# Patient Record
Sex: Male | Born: 1975 | Race: White | Hispanic: No | Marital: Married | Smoking: Never smoker
Health system: Southern US, Community
[De-identification: ages and names within clinical notes are randomized; demographics above are authoritative.]

## PROBLEM LIST (undated history)

## (undated) HISTORY — PX: OTHER SURGICAL HISTORY: SHX169

## (undated) HISTORY — PX: ACHILLES TENDON REPAIR: SUR1153

---

## 2018-12-05 DIAGNOSIS — M542 Cervicalgia: Secondary | ICD-10-CM | POA: Diagnosis not present

## 2018-12-05 DIAGNOSIS — M62838 Other muscle spasm: Secondary | ICD-10-CM | POA: Diagnosis not present

## 2018-12-05 DIAGNOSIS — Z79899 Other long term (current) drug therapy: Secondary | ICD-10-CM | POA: Diagnosis not present

## 2018-12-05 DIAGNOSIS — Z885 Allergy status to narcotic agent status: Secondary | ICD-10-CM | POA: Diagnosis not present

## 2018-12-06 DIAGNOSIS — M7662 Achilles tendinitis, left leg: Secondary | ICD-10-CM | POA: Diagnosis not present

## 2018-12-06 DIAGNOSIS — M9902 Segmental and somatic dysfunction of thoracic region: Secondary | ICD-10-CM | POA: Diagnosis not present

## 2018-12-06 DIAGNOSIS — M7661 Achilles tendinitis, right leg: Secondary | ICD-10-CM | POA: Diagnosis not present

## 2018-12-09 DIAGNOSIS — M7661 Achilles tendinitis, right leg: Secondary | ICD-10-CM | POA: Diagnosis not present

## 2018-12-09 DIAGNOSIS — M9902 Segmental and somatic dysfunction of thoracic region: Secondary | ICD-10-CM | POA: Diagnosis not present

## 2018-12-09 DIAGNOSIS — M7662 Achilles tendinitis, left leg: Secondary | ICD-10-CM | POA: Diagnosis not present

## 2018-12-13 DIAGNOSIS — M7661 Achilles tendinitis, right leg: Secondary | ICD-10-CM | POA: Diagnosis not present

## 2018-12-13 DIAGNOSIS — M7662 Achilles tendinitis, left leg: Secondary | ICD-10-CM | POA: Diagnosis not present

## 2018-12-13 DIAGNOSIS — M9902 Segmental and somatic dysfunction of thoracic region: Secondary | ICD-10-CM | POA: Diagnosis not present

## 2018-12-17 DIAGNOSIS — M7661 Achilles tendinitis, right leg: Secondary | ICD-10-CM | POA: Diagnosis not present

## 2018-12-17 DIAGNOSIS — M7662 Achilles tendinitis, left leg: Secondary | ICD-10-CM | POA: Diagnosis not present

## 2018-12-17 DIAGNOSIS — M9902 Segmental and somatic dysfunction of thoracic region: Secondary | ICD-10-CM | POA: Diagnosis not present

## 2018-12-30 ENCOUNTER — Emergency Department (HOSPITAL_COMMUNITY)
Admission: EM | Admit: 2018-12-30 | Discharge: 2018-12-30 | Disposition: A | Discharge status: Home / Self Care | Payer: Commercial Managed Care - PPO | Attending: Emergency Medicine | Admitting: Emergency Medicine

## 2018-12-30 ENCOUNTER — Encounter (HOSPITAL_COMMUNITY): Payer: Self-pay | Admitting: Emergency Medicine

## 2018-12-30 DIAGNOSIS — R21 Rash and other nonspecific skin eruption: Secondary | ICD-10-CM | POA: Diagnosis present

## 2018-12-30 DIAGNOSIS — L509 Urticaria, unspecified: Secondary | ICD-10-CM | POA: Diagnosis not present

## 2018-12-30 MED ORDER — LORATADINE 10 MG PO TABS: 10.0000 mg | ORAL_TABLET | Freq: Every day | ORAL | 0 refills | Status: AC

## 2018-12-30 NOTE — Discharge Instructions (Addendum)
Please read attached information. If you experience any new or worsening signs or symptoms please return to the emergency room for evaluation. Please follow-up with your primary care provider or specialist as discussed. Please use medication prescribed only as directed and discontinue taking if you have any concerning signs or symptoms.   °

## 2018-12-30 NOTE — ED Provider Notes (Signed)
[facility] EMERGENCY DEPARTMENT Provider Note   CSN: [id number] Arrival date & time: 12/30/18  0750    History   Chief Complaint Chief Complaint  Patient presents with  . Rash    HPI Casey Wilson is a 43 y.o. male.     HPI   43 year old male presents today with rash.  He notes he was driving to work today when he developed hives to his upper extremities chest abdomen and back.  He notes his hands began to swell.  He denies any swelling of the mouth, shortness of breath.  He denies any abnormal exposures, new foods or drinks, he notes he takes atorvastatin daily but did not take it today.  Patient notes he occasionally uses loratadine as needed for nasal congestion, did not take this today.  Denies any insect bites abnormal exposures of the weekend.  He notes his work uniform he wore on Thursday has not had any new exposures.  He does note that he works at [employer] and is exposed to various things at work.  He did not take any medication prior to arrival and symptoms are dramatically resolving.  History reviewed. No pertinent past medical history.  There are no active problems to display for this patient.   History reviewed. No pertinent surgical history.      Home Medications    Prior to Admission medications   Medication Sig Start Date End Date Taking? Authorizing Provider  loratadine (CLARITIN) 10 MG tablet Take 1 tablet (10 mg total) by mouth daily. 12/30/18   Eyvonne Mechanic, PA-C    Family History History reviewed. No pertinent family history.  Social History Social History   Tobacco Use  . Smoking status: Not on file  Substance Use Topics  . Alcohol use: Not on file  . Drug use: Not on file     Allergies   Patient has no allergy information on record.   Review of Systems Review of Systems  All other systems reviewed and are negative.    Physical Exam Updated Vital Signs BP (!) 127/94   Pulse 70   Temp 97.6 F (36.4 C) (Oral)    Resp 16   SpO2 95%   Physical Exam Vitals signs and nursing note reviewed.  Constitutional:      Appearance: He is well-developed.  HENT:     Head: Normocephalic and atraumatic.     Comments: Oropharynx is clear no edema or exudate, no lesions Eyes:     General: No scleral icterus.       Right eye: No discharge.        Left eye: No discharge.     Conjunctiva/sclera: Conjunctivae normal.     Pupils: Pupils are equal, round, and reactive to light.  Neck:     Musculoskeletal: Normal range of motion.     Vascular: No JVD.     Trachea: No tracheal deviation.  Pulmonary:     Effort: Pulmonary effort is normal. No respiratory distress.     Breath sounds: Normal breath sounds. No stridor. No wheezing, rhonchi or rales.  Skin:    Comments: Faint erythematous rash to the back-no hives noted, minor excoriation noted on the left dorsal hand  Neurological:     Mental Status: He is alert and oriented to person, place, and time.     Coordination: Coordination normal.  Psychiatric:        Behavior: Behavior normal.        Thought Content: Thought content normal.  Judgment: Judgment normal.      ED Treatments / Results  Labs (all labs ordered are listed, but only abnormal results are displayed) Labs Reviewed - No data to display  EKG None  Radiology No results found.  Procedures Procedures (including critical care time)  Medications Ordered in ED Medications - No data to display   Initial Impression / Assessment and Plan / ED Course  I have reviewed the triage vital signs and the nursing notes.  Pertinent labs & imaging results that were available during my care of the patient were reviewed by me and considered in my medical decision making (see chart for details).        43 year old male presents today with hives.  Patient has had resolution of symptoms prior to my evaluation.  He has no signs of respiratory involvement.  He is well-appearing in no acute  distress.  Uncertain etiology at this time.  Patient will use antihistamines at home return immediately if he develops any new or worsening signs or symptoms.  He verbalized understanding and agreement to today's plan had no further questions or concerns at time of discharge.  Final Clinical Impressions(s) / ED Diagnoses   Final diagnoses:  Hives    ED Discharge Orders         Ordered    loratadine (CLARITIN) 10 MG tablet  Daily     12/30/18 0813           Eyvonne Mechanic, PA-C 12/30/18 6314    Benjiman Core, MD 12/30/18 [phone]

## 2018-12-30 NOTE — ED Triage Notes (Signed)
Pt reports sudden hives around 630. Unaware if allergic to anything. States he had his work uniform on and not sure if a chemical was on it.

## 2019-01-20 ENCOUNTER — Other Ambulatory Visit (HOSPITAL_BASED_OUTPATIENT_CLINIC_OR_DEPARTMENT_OTHER): Payer: Self-pay | Admitting: Physician Assistant

## 2019-01-20 ENCOUNTER — Ambulatory Visit (HOSPITAL_BASED_OUTPATIENT_CLINIC_OR_DEPARTMENT_OTHER)
Admission: RE | Admit: 2019-01-20 | Discharge: 2019-01-20 | Disposition: A | Discharge status: Home / Self Care | Payer: Commercial Managed Care - PPO | Source: Ambulatory Visit | Attending: Physician Assistant | Admitting: Physician Assistant

## 2019-01-20 ENCOUNTER — Other Ambulatory Visit: Payer: Self-pay

## 2019-01-20 DIAGNOSIS — M7989 Other specified soft tissue disorders: Secondary | ICD-10-CM | POA: Diagnosis not present

## 2019-01-20 DIAGNOSIS — R609 Edema, unspecified: Secondary | ICD-10-CM

## 2019-01-20 DIAGNOSIS — L539 Erythematous condition, unspecified: Secondary | ICD-10-CM | POA: Insufficient documentation

## 2019-04-06 IMAGING — US VENOUS DOPPLER ULTRASOUND OF  LOWER EXTREMITIES
1 series · 14 of 24 positions shown · non-contrast
Comparison: None.

CLINICAL DATA: Bilateral lower extremity erythema and swelling for
2 days

EXAM:
BILATERAL LOWER EXTREMITY VENOUS DUPLEX ULTRASOUND
TECHNIQUE: Doppler venous assessment of the bilateral lower extremity deep
venous system was performed, including characterization of spectral
flow, compressibility, and phasicity.

[Series 1: venous doppler ultrasound of lower extremities · 14 of 59 slices shown]
[im 1/59]
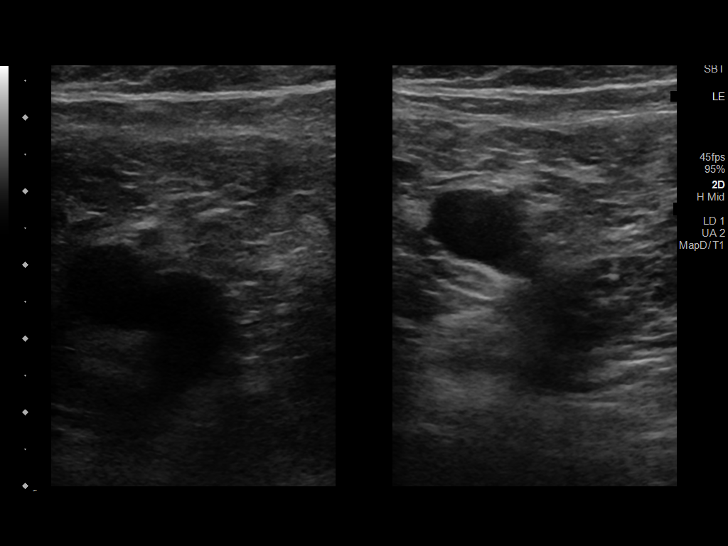
[im 6/59]
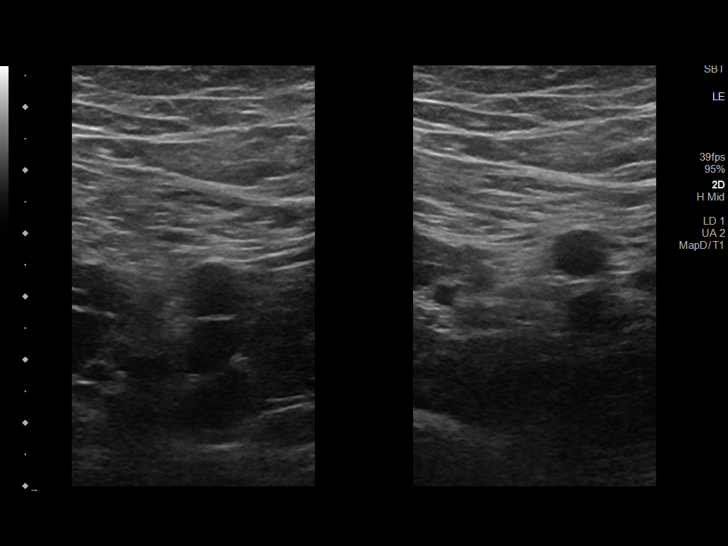
[im 11/59]
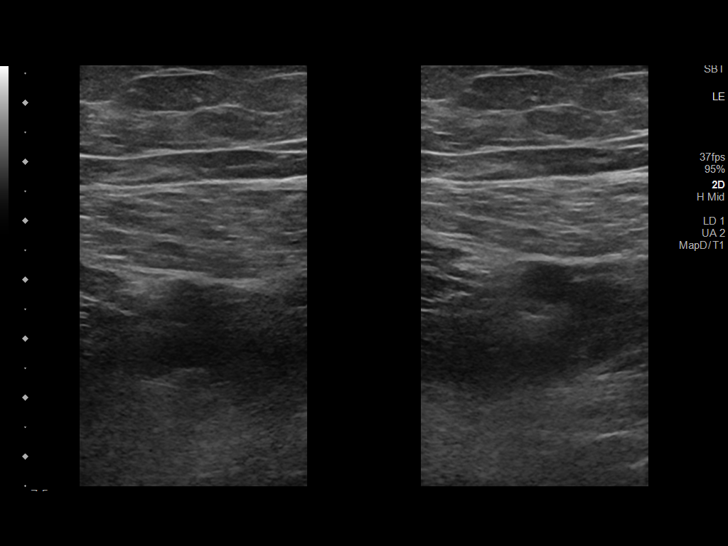
[im 16/59]
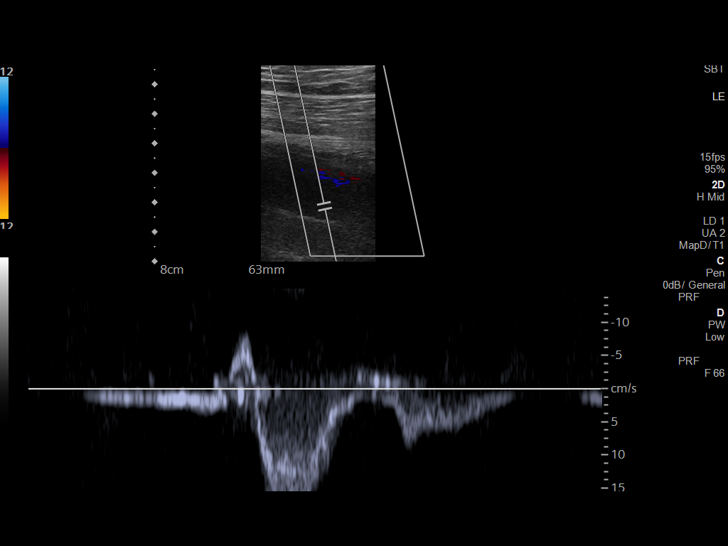
[im 18/59]
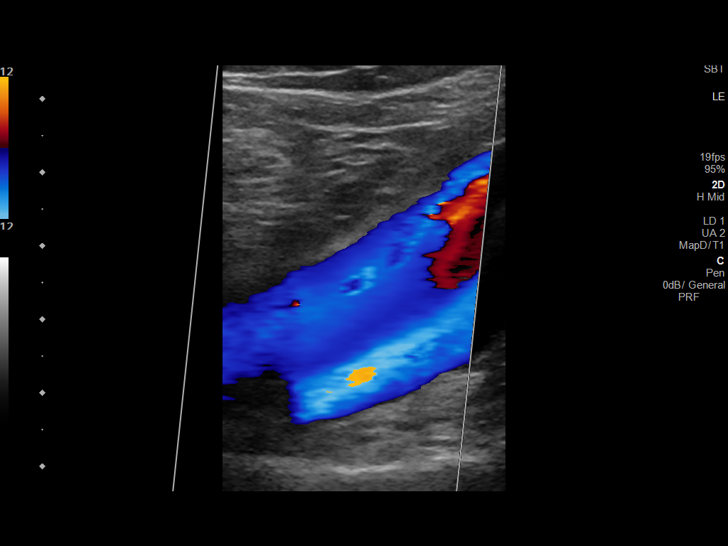
[im 23/59]
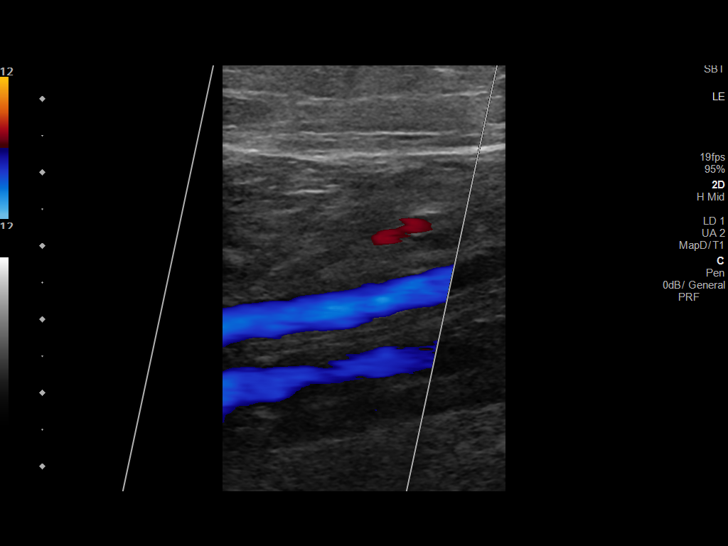
[im 28/59]
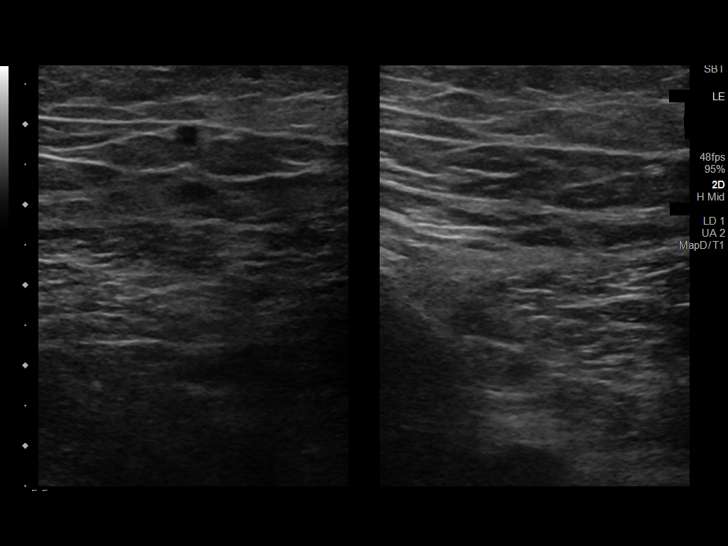
[im 31/59]
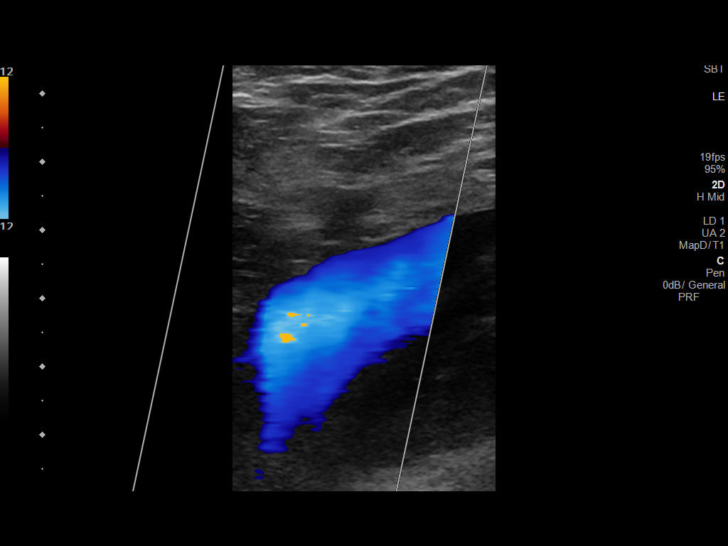
[im 36/59]
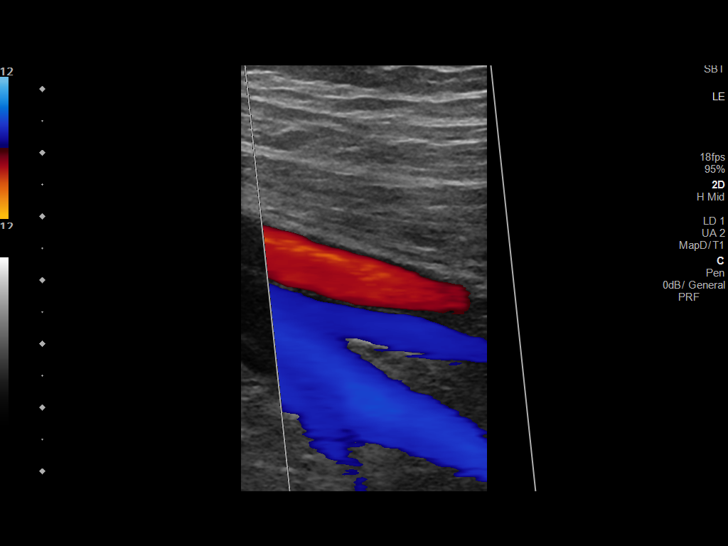
[im 41/59]
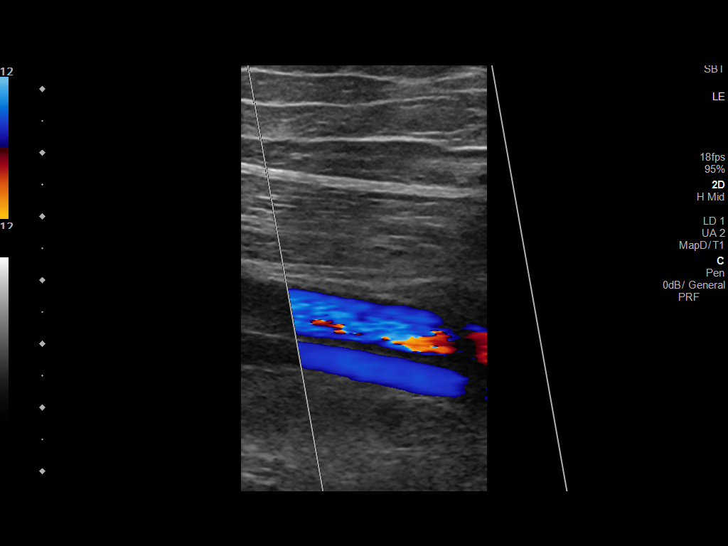
[im 46/59]
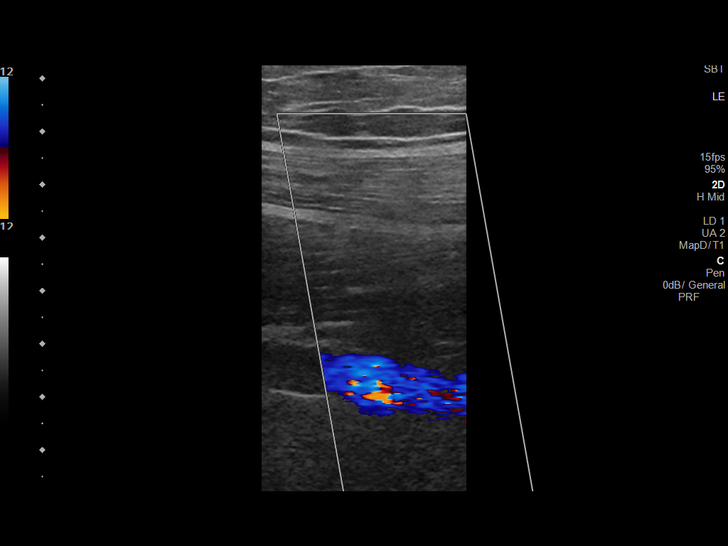
[im 48/59]
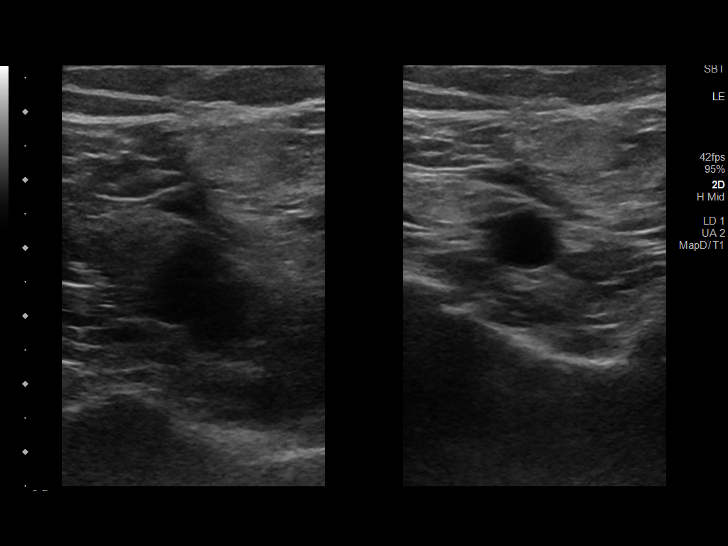
[im 53/59]
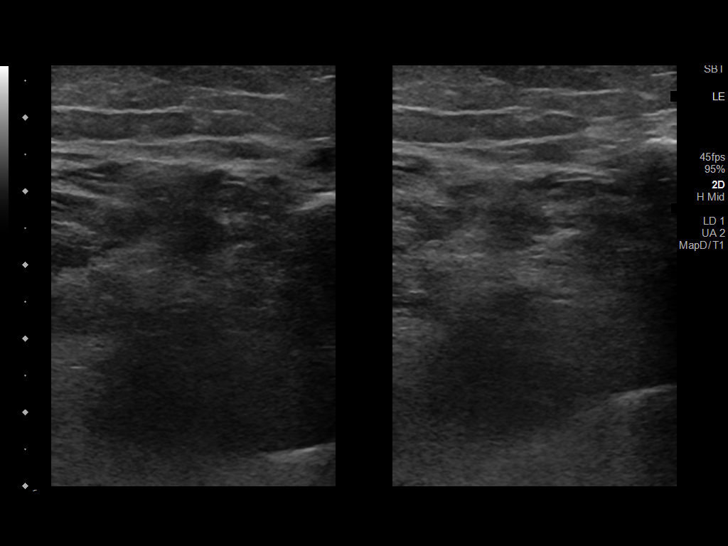
[im 59/59]
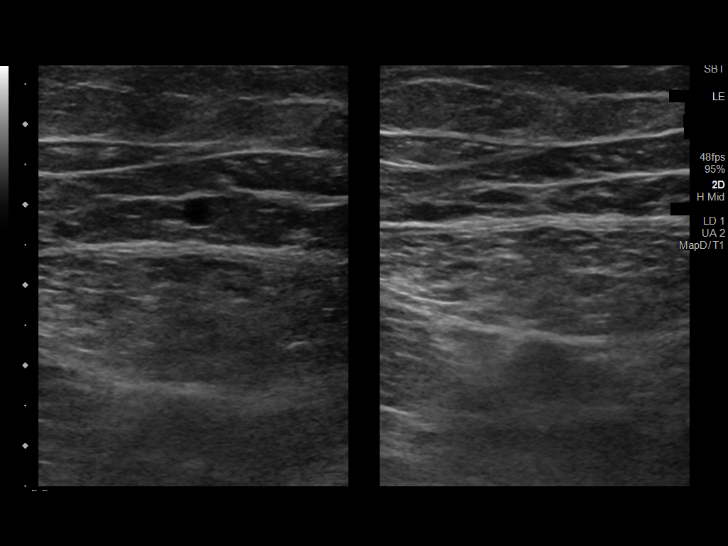

[14 of 24 positions shown; findings below may reference images not displayed]

FINDINGS: There is complete compressibility of the bilateral common femoral,
femoral, and popliteal veins. Doppler analysis demonstrates
respiratory phasicity and augmentation of flow with calf
compression. No obvious superficial vein or calf vein thrombosis.
IMPRESSION: No evidence of lower extremity DVT.

## 2020-11-08 ENCOUNTER — Emergency Department: Admit: 2020-11-08 | Discharge status: Absent without leave | Payer: Self-pay

## 2020-11-08 ENCOUNTER — Emergency Department (INDEPENDENT_AMBULATORY_CARE_PROVIDER_SITE_OTHER)
Admission: EM | Admit: 2020-11-08 | Discharge: 2020-11-08 | Disposition: A | Discharge status: Home / Self Care | Payer: Commercial Managed Care - PPO | Source: Home / Self Care

## 2020-11-08 ENCOUNTER — Other Ambulatory Visit: Payer: Self-pay

## 2020-11-08 DIAGNOSIS — R197 Diarrhea, unspecified: Secondary | ICD-10-CM

## 2020-11-08 DIAGNOSIS — R1084 Generalized abdominal pain: Secondary | ICD-10-CM

## 2020-11-08 MED ORDER — CIPROFLOXACIN HCL 500 MG PO TABS: 500.0000 mg | ORAL_TABLET | Freq: Two times a day (BID) | ORAL | 0 refills | Status: AC

## 2020-11-08 MED ORDER — METRONIDAZOLE 500 MG PO TABS: 500.0000 mg | ORAL_TABLET | Freq: Three times a day (TID) | ORAL | 0 refills | Status: AC

## 2020-11-08 NOTE — Discharge Instructions (Signed)
°  Please take the antibiotics as prescribed and be sure to schedule a follow up appointment with primary care or stomach specialist this week for recheck of symptoms, especially if not improving. Even if symptoms do resolve with medication prescribed, it is recommended you follow up as you may need further testing such as a colonoscopy.   Call 911 or have someone drive you to the hospital if symptoms significantly worsening.

## 2020-11-08 NOTE — ED Triage Notes (Signed)
Pt has been having recurrent diarrhea since 12/31. He is concerned for dehydration since not able to tolerate PO intake. Reports abdominal cramping as well. Denies being on antibiotics recently.
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
## 2020-11-15 LAB — TIQ-NTM

## 2020-11-17 LAB — GASTROINTESTINAL PATHOGEN PANEL PCR

## 2021-08-11 ENCOUNTER — Emergency Department (HOSPITAL_COMMUNITY): Payer: Commercial Managed Care - PPO

## 2021-08-11 ENCOUNTER — Observation Stay (HOSPITAL_COMMUNITY): Payer: Commercial Managed Care - PPO | Admitting: Anesthesiology

## 2021-08-11 ENCOUNTER — Inpatient Hospital Stay (HOSPITAL_COMMUNITY)
Admission: EM | Admit: 2021-08-11 | Discharge: 2021-08-15 | DRG: 511 | Disposition: A | Discharge status: Home / Self Care | Payer: Commercial Managed Care - PPO | Attending: Surgery | Admitting: Surgery

## 2021-08-11 ENCOUNTER — Encounter (HOSPITAL_COMMUNITY): Payer: Self-pay

## 2021-08-11 ENCOUNTER — Encounter (HOSPITAL_COMMUNITY): Admission: EM | Disposition: A | Discharge status: Home / Self Care | Payer: Self-pay | Source: Home / Self Care

## 2021-08-11 ENCOUNTER — Other Ambulatory Visit: Payer: Self-pay

## 2021-08-11 DIAGNOSIS — R531 Weakness: Secondary | ICD-10-CM

## 2021-08-11 DIAGNOSIS — S62102A Fracture of unspecified carpal bone, left wrist, initial encounter for closed fracture: Principal | ICD-10-CM

## 2021-08-11 DIAGNOSIS — M4856XA Collapsed vertebra, not elsewhere classified, lumbar region, initial encounter for fracture: Secondary | ICD-10-CM | POA: Diagnosis present

## 2021-08-11 DIAGNOSIS — S71112A Laceration without foreign body, left thigh, initial encounter: Secondary | ICD-10-CM | POA: Diagnosis present

## 2021-08-11 DIAGNOSIS — Z885 Allergy status to narcotic agent status: Secondary | ICD-10-CM

## 2021-08-11 DIAGNOSIS — S43102A Unspecified dislocation of left acromioclavicular joint, initial encounter: Secondary | ICD-10-CM | POA: Diagnosis present

## 2021-08-11 DIAGNOSIS — M4854XA Collapsed vertebra, not elsewhere classified, thoracic region, initial encounter for fracture: Secondary | ICD-10-CM | POA: Diagnosis present

## 2021-08-11 DIAGNOSIS — S52372A Galeazzi's fracture of left radius, initial encounter for closed fracture: Principal | ICD-10-CM | POA: Diagnosis present

## 2021-08-11 DIAGNOSIS — Y92410 Unspecified street and highway as the place of occurrence of the external cause: Secondary | ICD-10-CM

## 2021-08-11 DIAGNOSIS — E785 Hyperlipidemia, unspecified: Secondary | ICD-10-CM | POA: Diagnosis present

## 2021-08-11 DIAGNOSIS — Z20822 Contact with and (suspected) exposure to covid-19: Secondary | ICD-10-CM | POA: Diagnosis present

## 2021-08-11 HISTORY — PX: ORIF WRIST FRACTURE: SHX2133

## 2021-08-11 LAB — COMPREHENSIVE METABOLIC PANEL
ALT: 33 U/L (ref 0–44)
AST: 29 U/L (ref 15–41)
Albumin: 3.5 g/dL (ref 3.5–5.0)
Alkaline Phosphatase: 70 U/L (ref 38–126)
Anion gap: 8 (ref 5–15)
BUN: 11 mg/dL (ref 6–20)
CO2: 23 mmol/L (ref 22–32)
Calcium: 8.5 mg/dL — ABNORMAL LOW (ref 8.9–10.3)
Chloride: 105 mmol/L (ref 98–111)
Creatinine, Ser: 0.96 mg/dL (ref 0.61–1.24)
GFR, Estimated: >60 mL/min (ref >60)
Glucose, Bld: 131 mg/dL — ABNORMAL HIGH (ref 70–99)
Potassium: 3.6 mmol/L (ref 3.5–5.1)
Sodium: 136 mmol/L (ref 135–145)
Total Bilirubin: 0.9 mg/dL (ref 0.3–1.2)
Total Protein: 6.1 g/dL — ABNORMAL LOW (ref 6.5–8.1)

## 2021-08-11 LAB — URINALYSIS, ROUTINE W REFLEX MICROSCOPIC
Bilirubin Urine: NEGATIVE
Glucose, UA: NEGATIVE mg/dL
Hgb urine dipstick: NEGATIVE
Ketones, ur: NEGATIVE mg/dL
Leukocytes,Ua: NEGATIVE
Nitrite: NEGATIVE
Protein, ur: NEGATIVE mg/dL
Specific Gravity, Urine: 1.033 — ABNORMAL HIGH (ref 1.005–1.030)
pH: 7 (ref 5.0–8.0)

## 2021-08-11 LAB — PROTIME-INR
INR: 1 (ref 0.8–1.2)
Prothrombin Time: 13.2 seconds (ref 11.4–15.2)

## 2021-08-11 LAB — I-STAT CHEM 8, ED
BUN: 13 mg/dL (ref 6–20)
Calcium, Ion: 1.06 mmol/L — ABNORMAL LOW (ref 1.15–1.40)
Chloride: 105 mmol/L (ref 98–111)
Creatinine, Ser: 0.9 mg/dL (ref 0.61–1.24)
Glucose, Bld: 129 mg/dL — ABNORMAL HIGH (ref 70–99)
HCT: 43 % (ref 39.0–52.0)
Hemoglobin: 14.6 g/dL (ref 13.0–17.0)
Potassium: 3.7 mmol/L (ref 3.5–5.1)
Sodium: 139 mmol/L (ref 135–145)
TCO2: 23 mmol/L (ref 22–32)

## 2021-08-11 LAB — RESP PANEL BY RT-PCR (FLU A&B, COVID) ARPGX2
Influenza A by PCR: NEGATIVE
Influenza B by PCR: NEGATIVE
SARS Coronavirus 2 by RT PCR: NEGATIVE

## 2021-08-11 LAB — LACTIC ACID, PLASMA: Lactic Acid, Venous: 1.9 mmol/L (ref 0.5–1.9)

## 2021-08-11 LAB — CBC
HCT: 44.2 % (ref 39.0–52.0)
Hemoglobin: 14.9 g/dL (ref 13.0–17.0)
MCH: 30.9 pg (ref 26.0–34.0)
MCHC: 33.7 g/dL (ref 30.0–36.0)
MCV: 91.7 fL (ref 80.0–100.0)
Platelets: 177 10*3/uL (ref 150–400)
RBC: 4.82 MIL/uL (ref 4.22–5.81)
RDW: 13.6 % (ref 11.5–15.5)
WBC: 9.1 10*3/uL (ref 4.0–10.5)
nRBC: 0 % (ref 0.0–0.2)

## 2021-08-11 LAB — ETHANOL: Alcohol, Ethyl (B): <10 mg/dL (ref <10)

## 2021-08-11 LAB — SURGICAL PCR SCREEN
MRSA, PCR: NEGATIVE
Staphylococcus aureus: POSITIVE — AB

## 2021-08-11 LAB — SAMPLE TO BLOOD BANK

## 2021-08-11 SURGERY — OPEN REDUCTION INTERNAL FIXATION (ORIF) WRIST FRACTURE
Anesthesia: General | Site: Wrist | Laterality: Left

## 2021-08-11 MED ORDER — METHOCARBAMOL 500 MG PO TABS: 750.0000 mg | ORAL_TABLET | Freq: Four times a day (QID) | ORAL | Status: DC

## 2021-08-11 MED ORDER — ROCURONIUM BROMIDE 10 MG/ML (PF) SYRINGE
PREFILLED_SYRINGE | INTRAVENOUS | Status: AC
  Filled 2021-08-11: qty 10

## 2021-08-11 MED ORDER — ONDANSETRON HCL 4 MG/2ML IJ SOLN
INTRAMUSCULAR | Status: DC | PRN
  Administered 2021-08-11: 4 mg via INTRAVENOUS

## 2021-08-11 MED ORDER — FENTANYL CITRATE (PF) 250 MCG/5ML IJ SOLN
INTRAMUSCULAR | Status: AC
  Filled 2021-08-11: qty 5

## 2021-08-11 MED ORDER — SODIUM CHLORIDE 0.9 % IV BOLUS
1000.0000 mL | Freq: Once | INTRAVENOUS | Status: AC
  Administered 2021-08-11: 1000 mL via INTRAVENOUS

## 2021-08-11 MED ORDER — BUPIVACAINE HCL (PF) 0.25 % IJ SOLN
INTRAMUSCULAR | Status: AC
  Filled 2021-08-11: qty 20

## 2021-08-11 MED ORDER — FENTANYL CITRATE (PF) 250 MCG/5ML IJ SOLN
INTRAMUSCULAR | Status: DC | PRN
  Administered 2021-08-11: 50 ug via INTRAVENOUS
  Administered 2021-08-11: 100 ug via INTRAVENOUS
  Administered 2021-08-11: 50 ug via INTRAVENOUS

## 2021-08-11 MED ORDER — MORPHINE SULFATE (PF) 4 MG/ML IV SOLN
4.0000 mg | Freq: Once | INTRAVENOUS | Status: AC
  Administered 2021-08-11: 4 mg via INTRAVENOUS
  Filled 2021-08-11: qty 1

## 2021-08-11 MED ORDER — HYDROMORPHONE HCL 1 MG/ML IJ SOLN
1.0000 mg | Freq: Once | INTRAMUSCULAR | Status: AC
Start: 2021-08-11 — End: 2021-08-11
  Administered 2021-08-11: 1 mg via INTRAVENOUS
  Filled 2021-08-11: qty 1

## 2021-08-11 MED ORDER — IOHEXOL 350 MG/ML SOLN
80.0000 mL | Freq: Once | INTRAVENOUS | Status: AC | PRN
  Administered 2021-08-11: 80 mL via INTRAVENOUS

## 2021-08-11 MED ORDER — ONDANSETRON 4 MG PO TBDP: 4.0000 mg | ORAL_TABLET | Freq: Four times a day (QID) | ORAL | Status: DC | PRN

## 2021-08-11 MED ORDER — HYDROMORPHONE HCL 1 MG/ML IJ SOLN
1.0000 mg | Freq: Once | INTRAMUSCULAR | Status: AC
  Administered 2021-08-11: 1 mg via INTRAVENOUS
  Filled 2021-08-11: qty 1

## 2021-08-11 MED ORDER — ENOXAPARIN SODIUM 30 MG/0.3ML IJ SOSY
30.0000 mg | PREFILLED_SYRINGE | Freq: Two times a day (BID) | INTRAMUSCULAR | Status: DC
  Administered 2021-08-12 – 2021-08-15 (×7): 30 mg via SUBCUTANEOUS
  Filled 2021-08-11 (×8): qty 0.3

## 2021-08-11 MED ORDER — KETOROLAC TROMETHAMINE 30 MG/ML IJ SOLN: 30.0000 mg | Freq: Once | INTRAMUSCULAR | Status: DC | PRN

## 2021-08-11 MED ORDER — PANTOPRAZOLE SODIUM 40 MG PO TBEC
40.0000 mg | DELAYED_RELEASE_TABLET | Freq: Every day | ORAL | Status: DC
  Administered 2021-08-12 – 2021-08-15 (×4): 40 mg via ORAL
  Filled 2021-08-11 (×4): qty 1

## 2021-08-11 MED ORDER — PHENYLEPHRINE HCL-NACL 20-0.9 MG/250ML-% IV SOLN
INTRAVENOUS | Status: DC | PRN
  Administered 2021-08-11: 40 ug/min via INTRAVENOUS

## 2021-08-11 MED ORDER — POLYETHYLENE GLYCOL 3350 17 G PO PACK: 17.0000 g | PACK | Freq: Every day | ORAL | Status: DC | PRN

## 2021-08-11 MED ORDER — ONDANSETRON HCL 4 MG/2ML IJ SOLN
INTRAMUSCULAR | Status: AC
  Filled 2021-08-11: qty 2

## 2021-08-11 MED ORDER — BUPIVACAINE HCL 0.25 % IJ SOLN
INTRAMUSCULAR | Status: DC | PRN
Start: 2021-08-11 — End: 2021-08-11
  Administered 2021-08-11: 10 mL

## 2021-08-11 MED ORDER — CEFAZOLIN IN SODIUM CHLORIDE 3-0.9 GM/100ML-% IV SOLN
3.0000 g | INTRAVENOUS | Status: AC
  Administered 2021-08-11: 3 g via INTRAVENOUS

## 2021-08-11 MED ORDER — MIDAZOLAM HCL 2 MG/2ML IJ SOLN
INTRAMUSCULAR | Status: AC
  Administered 2021-08-11: 1 mg via INTRAVENOUS
  Filled 2021-08-11: qty 2

## 2021-08-11 MED ORDER — OXYCODONE HCL 5 MG PO TABS: 5.0000 mg | ORAL_TABLET | Freq: Once | ORAL | Status: DC | PRN

## 2021-08-11 MED ORDER — CHLORHEXIDINE GLUCONATE 4 % EX LIQD: 60.0000 mL | Freq: Once | CUTANEOUS | Status: DC

## 2021-08-11 MED ORDER — MIDAZOLAM HCL 2 MG/2ML IJ SOLN
INTRAMUSCULAR | Status: AC
  Filled 2021-08-11: qty 2

## 2021-08-11 MED ORDER — CHLORHEXIDINE GLUCONATE 0.12 % MT SOLN: 15.0000 mL | Freq: Once | OROMUCOSAL | Status: AC

## 2021-08-11 MED ORDER — ORAL CARE MOUTH RINSE: 15.0000 mL | Freq: Once | OROMUCOSAL | Status: AC

## 2021-08-11 MED ORDER — CHLORHEXIDINE GLUCONATE 0.12 % MT SOLN
OROMUCOSAL | Status: AC
  Administered 2021-08-11: 15 mL via OROMUCOSAL
  Filled 2021-08-11: qty 15

## 2021-08-11 MED ORDER — FENTANYL CITRATE PF 50 MCG/ML IJ SOSY
50.0000 ug | PREFILLED_SYRINGE | Freq: Once | INTRAMUSCULAR | Status: AC
  Administered 2021-08-11: 50 ug via INTRAVENOUS

## 2021-08-11 MED ORDER — SODIUM CHLORIDE 0.9 % IV SOLN
INTRAVENOUS | Status: DC
Start: 2021-08-11 — End: 2021-08-13

## 2021-08-11 MED ORDER — ASCORBIC ACID 500 MG PO TABS
1000.0000 mg | ORAL_TABLET | Freq: Every day | ORAL | Status: DC
  Administered 2021-08-11 – 2021-08-15 (×4): 1000 mg via ORAL
  Filled 2021-08-11 (×5): qty 2

## 2021-08-11 MED ORDER — MIDAZOLAM HCL 2 MG/2ML IJ SOLN: 1.0000 mg | Freq: Once | INTRAMUSCULAR | Status: AC

## 2021-08-11 MED ORDER — LIDOCAINE-EPINEPHRINE (PF) 2 %-1:200000 IJ SOLN
10.0000 mL | Freq: Once | INTRAMUSCULAR | Status: AC
  Administered 2021-08-11: 10 mL via INTRADERMAL
  Filled 2021-08-11: qty 20

## 2021-08-11 MED ORDER — CEFAZOLIN IN SODIUM CHLORIDE 3-0.9 GM/100ML-% IV SOLN
INTRAVENOUS | Status: AC
  Filled 2021-08-11: qty 100

## 2021-08-11 MED ORDER — ONDANSETRON HCL 4 MG/2ML IJ SOLN: 4.0000 mg | Freq: Once | INTRAMUSCULAR | Status: DC | PRN

## 2021-08-11 MED ORDER — OXYCODONE HCL 5 MG PO TABS
5.0000 mg | ORAL_TABLET | ORAL | Status: DC | PRN
  Administered 2021-08-12 – 2021-08-15 (×10): 10 mg via ORAL
  Filled 2021-08-11 (×10): qty 2
  Filled 2021-08-11: qty 1
  Filled 2021-08-11: qty 2

## 2021-08-11 MED ORDER — LIDOCAINE 2% (20 MG/ML) 5 ML SYRINGE
INTRAMUSCULAR | Status: AC
  Filled 2021-08-11: qty 5

## 2021-08-11 MED ORDER — ROCURONIUM BROMIDE 10 MG/ML (PF) SYRINGE
PREFILLED_SYRINGE | INTRAVENOUS | Status: DC | PRN
  Administered 2021-08-11: 60 mg via INTRAVENOUS
  Administered 2021-08-11: 40 mg via INTRAVENOUS

## 2021-08-11 MED ORDER — PROPOFOL 10 MG/ML IV BOLUS
INTRAVENOUS | Status: DC | PRN
  Administered 2021-08-11: 180 mg via INTRAVENOUS

## 2021-08-11 MED ORDER — METHOCARBAMOL 750 MG PO TABS
750.0000 mg | ORAL_TABLET | Freq: Four times a day (QID) | ORAL | Status: DC
  Administered 2021-08-11 – 2021-08-15 (×15): 750 mg via ORAL
  Filled 2021-08-11 (×15): qty 1

## 2021-08-11 MED ORDER — LIDOCAINE 2% (20 MG/ML) 5 ML SYRINGE
INTRAMUSCULAR | Status: DC | PRN
  Administered 2021-08-11: 100 mg via INTRAVENOUS

## 2021-08-11 MED ORDER — METOPROLOL TARTRATE 5 MG/5ML IV SOLN: 5.0000 mg | Freq: Four times a day (QID) | INTRAVENOUS | Status: DC | PRN

## 2021-08-11 MED ORDER — LACTATED RINGERS IV SOLN: INTRAVENOUS | Status: DC

## 2021-08-11 MED ORDER — MIDAZOLAM HCL 2 MG/2ML IJ SOLN
INTRAMUSCULAR | Status: DC | PRN
  Administered 2021-08-11: 2 mg via INTRAVENOUS

## 2021-08-11 MED ORDER — SUCCINYLCHOLINE CHLORIDE 200 MG/10ML IV SOSY
PREFILLED_SYRINGE | INTRAVENOUS | Status: DC | PRN
  Administered 2021-08-11: 200 mg via INTRAVENOUS

## 2021-08-11 MED ORDER — ONDANSETRON HCL 4 MG/2ML IJ SOLN
4.0000 mg | Freq: Four times a day (QID) | INTRAMUSCULAR | Status: DC | PRN
  Administered 2021-08-11 – 2021-08-12 (×2): 4 mg via INTRAVENOUS
  Filled 2021-08-11 (×2): qty 2

## 2021-08-11 MED ORDER — TETANUS-DIPHTH-ACELL PERTUSSIS 5-2.5-18.5 LF-MCG/0.5 IM SUSY
0.5000 mL | PREFILLED_SYRINGE | Freq: Once | INTRAMUSCULAR | Status: DC
  Filled 2021-08-11 (×2): qty 0.5

## 2021-08-11 MED ORDER — POVIDONE-IODINE 10 % EX SWAB
2.0000 "application " | Freq: Once | CUTANEOUS | Status: AC
  Administered 2021-08-11: 2 via TOPICAL

## 2021-08-11 MED ORDER — SUGAMMADEX SODIUM 200 MG/2ML IV SOLN
INTRAVENOUS | Status: DC | PRN
  Administered 2021-08-11: 300 mg via INTRAVENOUS

## 2021-08-11 MED ORDER — SUGAMMADEX SODIUM 500 MG/5ML IV SOLN
INTRAVENOUS | Status: AC
  Filled 2021-08-11: qty 5

## 2021-08-11 MED ORDER — DEXAMETHASONE SODIUM PHOSPHATE 10 MG/ML IJ SOLN
INTRAMUSCULAR | Status: AC
  Filled 2021-08-11: qty 1

## 2021-08-11 MED ORDER — DEXAMETHASONE SODIUM PHOSPHATE 10 MG/ML IJ SOLN
INTRAMUSCULAR | Status: DC | PRN
  Administered 2021-08-11: 10 mg via INTRAVENOUS

## 2021-08-11 MED ORDER — SODIUM CHLORIDE 0.9 % IR SOLN
Status: DC | PRN
  Administered 2021-08-11: 1000 mL

## 2021-08-11 MED ORDER — ROPIVACAINE HCL 5 MG/ML IJ SOLN
INTRAMUSCULAR | Status: DC | PRN
  Administered 2021-08-11: 30 mL via PERINEURAL

## 2021-08-11 MED ORDER — SUCCINYLCHOLINE CHLORIDE 200 MG/10ML IV SOSY
PREFILLED_SYRINGE | INTRAVENOUS | Status: AC
  Filled 2021-08-11: qty 10

## 2021-08-11 MED ORDER — HYDROMORPHONE HCL 1 MG/ML IJ SOLN
1.0000 mg | INTRAMUSCULAR | Status: DC | PRN
  Administered 2021-08-11 – 2021-08-12 (×4): 1 mg via INTRAVENOUS
  Filled 2021-08-11 (×5): qty 1

## 2021-08-11 MED ORDER — OXYCODONE HCL 5 MG/5ML PO SOLN: 5.0000 mg | Freq: Once | ORAL | Status: DC | PRN

## 2021-08-11 MED ORDER — FENTANYL CITRATE PF 50 MCG/ML IJ SOSY
50.0000 ug | PREFILLED_SYRINGE | Freq: Once | INTRAMUSCULAR | Status: DC
Start: 2021-08-11 — End: 2021-08-11

## 2021-08-11 MED ORDER — SODIUM CHLORIDE 0.9 % IV BOLUS
1000.0000 mL | Freq: Once | INTRAVENOUS | Status: AC
Start: 2021-08-11 — End: 2021-08-11
  Administered 2021-08-11: 1000 mL via INTRAVENOUS

## 2021-08-11 MED ORDER — DOCUSATE SODIUM 100 MG PO CAPS
100.0000 mg | ORAL_CAPSULE | Freq: Two times a day (BID) | ORAL | Status: DC
  Administered 2021-08-11 – 2021-08-15 (×8): 100 mg via ORAL
  Filled 2021-08-11 (×8): qty 1

## 2021-08-11 MED ORDER — PROPOFOL 10 MG/ML IV BOLUS
INTRAVENOUS | Status: AC
  Filled 2021-08-11: qty 20

## 2021-08-11 MED ORDER — ONDANSETRON HCL 4 MG/2ML IJ SOLN
4.0000 mg | Freq: Once | INTRAMUSCULAR | Status: AC
  Administered 2021-08-11: 4 mg via INTRAVENOUS
  Filled 2021-08-11: qty 2

## 2021-08-11 MED ORDER — HYDROMORPHONE HCL 1 MG/ML IJ SOLN: 0.2500 mg | INTRAMUSCULAR | Status: DC | PRN

## 2021-08-11 MED ORDER — FENTANYL CITRATE PF 50 MCG/ML IJ SOSY
PREFILLED_SYRINGE | INTRAMUSCULAR | Status: AC
  Filled 2021-08-11: qty 1

## 2021-08-11 MED ORDER — BISACODYL 10 MG RE SUPP: 10.0000 mg | Freq: Every day | RECTAL | Status: DC | PRN

## 2021-08-11 MED ORDER — BUPIVACAINE HCL (PF) 0.25 % IJ SOLN
INTRAMUSCULAR | Status: AC
  Filled 2021-08-11: qty 10

## 2021-08-11 MED ORDER — ACETAMINOPHEN 500 MG PO TABS
1000.0000 mg | ORAL_TABLET | Freq: Three times a day (TID) | ORAL | Status: DC
  Administered 2021-08-11: 1000 mg via ORAL
  Filled 2021-08-11: qty 2

## 2021-08-11 MED ORDER — PANTOPRAZOLE SODIUM 40 MG IV SOLR: 40.0000 mg | Freq: Every day | INTRAVENOUS | Status: DC

## 2021-08-11 MED ORDER — FENTANYL CITRATE (PF) 100 MCG/2ML IJ SOLN
INTRAMUSCULAR | Status: AC
  Administered 2021-08-11: 50 ug
  Filled 2021-08-11: qty 2

## 2021-08-11 MED ORDER — ACETAMINOPHEN 500 MG PO TABS: 1000.0000 mg | ORAL_TABLET | Freq: Three times a day (TID) | ORAL | Status: DC

## 2021-08-11 SURGICAL SUPPLY — 44 items
BAG COUNTER SPONGE SURGICOUNT (BAG) ×2 IMPLANT
BIT DRILL 2.8 QUICK RELEASE (BIT) ×1 IMPLANT
BNDG COHESIVE 2X5 TAN STRL LF (GAUZE/BANDAGES/DRESSINGS) IMPLANT
BNDG ELASTIC 4X5.8 VLCR STR LF (GAUZE/BANDAGES/DRESSINGS) ×4 IMPLANT
BNDG ESMARK 4X9 LF (GAUZE/BANDAGES/DRESSINGS) ×2 IMPLANT
BNDG GAUZE ELAST 4 BULKY (GAUZE/BANDAGES/DRESSINGS) ×4 IMPLANT
CORD BIPOLAR FORCEPS 12FT (ELECTRODE) ×2 IMPLANT
DRILL 2.8 QUICK RELEASE (BIT) ×2
DRSG ADAPTIC 3X8 NADH LF (GAUZE/BANDAGES/DRESSINGS) ×2 IMPLANT
DRSG EMULSION OIL 3X3 NADH (GAUZE/BANDAGES/DRESSINGS) ×2 IMPLANT
DRSG PAD ABDOMINAL 8X10 ST (GAUZE/BANDAGES/DRESSINGS) ×4 IMPLANT
GAUZE SPONGE 4X4 12PLY STRL (GAUZE/BANDAGES/DRESSINGS) ×2 IMPLANT
GAUZE XEROFORM 1X8 LF (GAUZE/BANDAGES/DRESSINGS) ×2 IMPLANT
GAUZE XEROFORM 5X9 LF (GAUZE/BANDAGES/DRESSINGS) ×2 IMPLANT
GOWN STRL REUS W/ TWL LRG LVL3 (GOWN DISPOSABLE) ×1 IMPLANT
GOWN STRL REUS W/TWL LRG LVL3 (GOWN DISPOSABLE) ×1
GUIDEWIRE ORTHO MINI ACTK .045 (WIRE) ×4 IMPLANT
HANDPIECE INTERPULSE COAX TIP (DISPOSABLE)
KIT BASIN OR (CUSTOM PROCEDURE TRAY) ×2 IMPLANT
KIT TURNOVER KIT B (KITS) ×2 IMPLANT
MANIFOLD NEPTUNE II (INSTRUMENTS) ×2 IMPLANT
NS IRRIG 1000ML POUR BTL (IV SOLUTION) ×2 IMPLANT
PACK ORTHO EXTREMITY (CUSTOM PROCEDURE TRAY) ×2 IMPLANT
PAD ARMBOARD 7.5X6 YLW CONV (MISCELLANEOUS) ×4 IMPLANT
PAD CAST 4YDX4 CTTN HI CHSV (CAST SUPPLIES) ×4 IMPLANT
PADDING CAST COTTON 4X4 STRL (CAST SUPPLIES) ×4
PLATE 8 HOLE RADIUS (Plate) ×2 IMPLANT
SCREW HEXALOBE NON-LOCK 3.5X14 (Screw) ×12 IMPLANT
SCREW NLCKG 13 3.5X13 HEXA (Screw) ×2 IMPLANT
SCREW NON-LOCK 3.5X13 (Screw) ×4 IMPLANT
SET HNDPC FAN SPRY TIP SCT (DISPOSABLE) IMPLANT
SPLINT PLASTER EXTRA FAST 3X15 (CAST SUPPLIES) ×3
SPLINT PLASTER GYPS XFAST 3X15 (CAST SUPPLIES) ×3 IMPLANT
SPONGE T-LAP 18X18 ~~LOC~~+RFID (SPONGE) ×2 IMPLANT
SPONGE T-LAP 4X18 ~~LOC~~+RFID (SPONGE) ×2 IMPLANT
SUT ETHILON 3 0 PS 1 (SUTURE) ×4 IMPLANT
SUT VIC AB 3-0 FS2 27 (SUTURE) ×2 IMPLANT
SWAB CULTURE ESWAB REG 1ML (MISCELLANEOUS) IMPLANT
TOWEL GREEN STERILE (TOWEL DISPOSABLE) ×2 IMPLANT
TOWEL GREEN STERILE FF (TOWEL DISPOSABLE) ×2 IMPLANT
TUBE CONNECTING 12X1/4 (SUCTIONS) ×2 IMPLANT
UNDERPAD 30X36 HEAVY ABSORB (UNDERPADS AND DIAPERS) ×2 IMPLANT
WATER STERILE IRR 1000ML POUR (IV SOLUTION) ×2 IMPLANT
YANKAUER SUCT BULB TIP NO VENT (SUCTIONS) ×2 IMPLANT
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
## 2021-08-11 NOTE — ED Notes (Signed)
Obtained consent for procedure. Pt's wife has all pt's belongings.

## 2021-08-11 NOTE — Anesthesia Preprocedure Evaluation (Addendum)
Anesthesia Evaluation  Patient identified by MRN, date of birth, ID band Patient awake    Reviewed: Allergy & Precautions, NPO status , Patient's Chart, lab work & pertinent test results  Airway Mallampati: II  TM Distance: >3 FB Neck ROM: Full    Dental no notable dental hx.    Pulmonary neg pulmonary ROS,    Pulmonary exam normal breath sounds clear to auscultation       Cardiovascular negative cardio ROS Normal cardiovascular exam Rhythm:Regular Rate:Normal     Neuro/Psych negative neurological ROS  negative psych ROS   GI/Hepatic negative GI ROS, Neg liver ROS,   Endo/Other  Morbid obesity  Renal/GU negative Renal ROS  negative genitourinary   Musculoskeletal negative musculoskeletal ROS (+)   Abdominal   Peds negative pediatric ROS (+)  Hematology negative hematology ROS (+)   Anesthesia Other Findings   Reproductive/Obstetrics negative OB ROS                            Anesthesia Physical Anesthesia Plan  ASA: 2  Anesthesia Plan: General   Post-op Pain Management:  Regional for Post-op pain   Induction: Intravenous  PONV Risk Score and Plan: 2 and Ondansetron and Dexamethasone  Airway Management Planned: Oral ETT  Additional Equipment:   Intra-op Plan:   Post-operative Plan: Extubation in OR  Informed Consent: I have reviewed the patients History and Physical, chart, labs and discussed the procedure including the risks, benefits and alternatives for the proposed anesthesia with the patient or authorized representative who has indicated his/her understanding and acceptance.     Dental advisory given  Plan Discussed with: CRNA and Surgeon  Anesthesia Plan Comments: (Patient vomited as I was preparing to do block. Still has mild nausea)       Anesthesia Quick Evaluation

## 2021-08-11 NOTE — Progress Notes (Signed)
OT Cancellation Note  Patient Details Name: Casey Wilson MRN: [id number] DOB: 12-11-1975   Cancelled Treatment:    Reason Eval/Treat Not Completed: Other (comment). Per Neurosurgery note: "CT L spine showed mild L1 superior endplate compression fracture.  Alignment appears intact.  Recommend TLSO brace when OOB." In order for OT to work with patient and determine transfers and ADL Pre-Op of LUE brace must be present. OT is pending brace arrival. PT communicating with Brace supplier ([employer]) and OT will perform evaluation once notified of arrival of TLSO brace.  Evern Bio Sudie Bandel 08/11/2021, 11:25 AM  Nyoka Cowden OTR/L Acute Rehabilitation Services Pager: [phone] Office: [phone]

## 2021-08-11 NOTE — Op Note (Signed)
I assisted Surgeon(s) and Role:    * Betha Loa, MD - Primary    Cindee Salt, MD - Assisting on the Procedure(s): OPEN REDUCTION INTERNAL FIXATION (ORIF) DISTAL RADIUS POSSIBLE PINNING DRUJ, POSSIBLE TRIANGULAR FIBER CARTILAGE COMPLEX REPAIR on 08/11/2021.  I provided assistance on this case as follows: setup, approach, identification, reduction, stabilization, and fixation of the radius fracture, assessment of the DRUJ, closure of the wound and application of dressings and  splint.  Electronically signed by: Cindee Salt, MD Date: 08/11/2021 Time: 5:08 PM

## 2021-08-11 NOTE — ED Notes (Signed)
Pt transport CT 2 with TCRN
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
## 2021-08-11 NOTE — Transfer of Care (Signed)
Immediate Anesthesia Transfer of Care Note  Patient: Casey Wilson  Procedure(s) Performed: OPEN REDUCTION INTERNAL FIXATION (ORIF) DISTAL RADIUS POSSIBLE PINNING DRUJ, POSSIBLE TRIANGULAR FIBER CARTILAGE COMPLEX REPAIR (Left: Wrist)  Patient Location: PACU  Anesthesia Type:General  Level of Consciousness: drowsy  Airway & Oxygen Therapy: Patient Spontanous Breathing and Patient connected to face mask oxygen  Post-op Assessment: Report given to RN and Post -op Vital signs reviewed and stable  Post vital signs: Reviewed and stable  Last Vitals:  Vitals Value Taken Time  BP 137/91 08/11/21 1728  Temp 36.6 C 08/11/21 1728  Pulse 82 08/11/21 1736  Resp 13 08/11/21 1736  SpO2 94 % 08/11/21 1736  Vitals shown include unvalidated device data.  Last Pain:  Vitals:   08/11/21 1728  TempSrc:   PainSc: Asleep         Complications: No notable events documented.
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
## 2021-08-11 NOTE — Progress Notes (Signed)
Orthopedic Tech Progress Note Patient Details:  Casey Wilson June 07, 1976 [id number]  Ortho Devices Type of Ortho Device: Sugartong splint Ortho Device/Splint Location: lue Ortho Device/Splint Interventions: Ordered, Application, Adjustment   Post Interventions Patient Tolerated: Well Instructions Provided: Poper ambulation with device, Care of device Waiting on TLSO to be delivered.  Casey Wilson L [name] 08/11/2021, 11:27 AM

## 2021-08-11 NOTE — ED Notes (Signed)
PT at bedside.

## 2021-08-11 NOTE — Progress Notes (Addendum)
Patient arrived to floor 1845. Patient stable. VS good. Skin assessed and documented. Tele placed on patient. Wife at bedside. Meal ordered from cafe. Settled patient in for nightshift.   Allergy: acetaminophen combined with oxycodone makes the patient act out and mean per wife.

## 2021-08-11 NOTE — Anesthesia Procedure Notes (Signed)
Procedure Name: Intubation Date/Time: 08/11/2021 3:49 PM Performed by: Tressia Miners, CRNA Pre-anesthesia Checklist: Patient identified, Emergency Drugs available, Suction available and Patient being monitored Patient Re-evaluated:Patient Re-evaluated prior to induction Oxygen Delivery Method: Circle System Utilized Preoxygenation: Pre-oxygenation with 100% oxygen Induction Type: IV induction, Rapid sequence and Cricoid Pressure applied Laryngoscope Size: Glidescope and 4 Grade View: Grade I Tube type: Oral Tube size: 8.0 mm Number of attempts: 1 Airway Equipment and Method: Rigid stylet and Video-laryngoscopy Placement Confirmation: ETT inserted through vocal cords under direct vision, positive ETCO2 and breath sounds checked- equal and bilateral Secured at: 23 cm Tube secured with: Tape Dental Injury: Teeth and Oropharynx as per pre-operative assessment  Comments: Pt is nauseous and vomiting in pre-op. RSI with glidescope with successful intubation. OGT passed to decompress stomach
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
## 2021-08-11 NOTE — Anesthesia Procedure Notes (Signed)
Anesthesia Procedure Image    

## 2021-08-11 NOTE — Consult Note (Signed)
CC:  s/p [facility]  HPI:     Patient is a 45 y.o. male presents after [facility].  He complains of left wrist and left shoulder pain, as well as moderate low back pain.  He underwent L wrist ORIF a few hours ago.  No neurologic complaints.    Patient Active Problem List   Diagnosis Date Noted   Left wrist fracture 08/11/2021   History reviewed. No pertinent past medical history.  History reviewed. No pertinent surgical history.  Medications Prior to Admission  Medication Sig Dispense Refill Last Dose   atorvastatin (LIPITOR) 20 MG tablet Take 20 mg by mouth daily.      loratadine (CLARITIN) 10 MG tablet Take 10 mg by mouth daily.      Not on File  Social History   Tobacco Use   Smoking status: Never   Smokeless tobacco: Never  Substance Use Topics   Alcohol use: Not on file    History reviewed. No pertinent family history.   Review of Systems Pertinent items noted in HPI and remainder of comprehensive ROS otherwise negative.  Objective:   Patient Vitals for the past 8 hrs:  BP Temp Temp src Pulse Resp SpO2  08/11/21 1836 132/72 98.4 F (36.9 C) Oral 71 -- 91 %  08/11/21 1813 (!) 142/88 98.4 F (36.9 C) -- 68 15 91 %  08/11/21 1758 (!) 143/93 -- -- 74 15 92 %  08/11/21 1743 (!) 143/94 -- -- 83 13 94 %  08/11/21 1728 (!) 137/91 97.8 F (36.6 C) -- 90 17 96 %  08/11/21 1424 127/80 -- -- 70 13 95 %  08/11/21 1419 (!) 136/94 -- -- 67 16 94 %  08/11/21 1409 (!) 147/81 -- -- 69 16 96 %  08/11/21 1403 (!) 147/86 -- -- 66 17 96 %  08/11/21 1340 137/75 97.9 F (36.6 C) Oral 71 16 93 %  08/11/21 1317 -- 98 F (36.7 C) Oral -- -- --  08/11/21 1315 (!) 111/91 -- -- 69 -- 93 %  08/11/21 1300 130/76 -- -- 66 -- 94 %  08/11/21 1215 136/72 -- -- 68 17 97 %  08/11/21 1145 120/75 -- -- 64 17 95 %  08/11/21 1100 129/80 -- -- 73 20 93 %   No intake/output data recorded. Total I/O In: 8100 [I.V.:5000; IV Piggyback:3100] Out: 1470 [Urine:1450; Blood:20]      General : Alert,  cooperative, no distress, appears stated age   Head:  Normocephalic/atraumatic    Eyes: PERRL, conjunctiva/corneas clear, EOM's intact. Fundi could not be visualized Neck: Supple Chest:  Respirations unlabored Chest wall: no tenderness or deformity Heart: Regular rate and rhythm Abdomen: Soft, nontender and nondistended Extremities: L wrist splinted Skin: normal turgor, color and texture Neurologic:  Alert, oriented x 3.  Eyes open spontaneously. PERRL, EOMI, VFC, no facial droop. V1-3 intact.  No dysarthria, tongue protrusion symmetric.  CNII-XII intact. Normal strength, sensation and reflexes throughout.  No pronator drift, full strength in legs       Data ReviewRadiology review:   Mild L1 superior endplate fracture, likely acute.  Questionable T3 superior endplate compression fracture, age-indeterminate  Assessment:   Active Problems:   Left wrist fracture  L1 superior endplate fracture after [facility]  Plan:   - recommend TLSO be worn when OOB, avoid repetitive bending, twisting, or forceful lifting - he can f/u in neurosurgery clinic in 4-6 weeks with a standing x-ray in the brace

## 2021-08-11 NOTE — Progress Notes (Signed)
  Inpatient Rehab Admissions Coordinator :  Per therapy recommendations patient was screened for CIR candidacy by Ottie Glazier RN MSN. Patient is not yet at a level to tolerate the intensity required to pursue a CIR admit due to eval on ED stretcher. Patient may have the potential to progress to become a candidate. The CIR admissions team will follow and monitor for progress and place a Rehab Consult order if felt to be appropriate. Please contact me with any questions.  Ottie Glazier RN MSN Admissions Coordinator [phone]
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
## 2021-08-11 NOTE — ED Notes (Signed)
Help patient use the urinal patient is resting with family at bedside

## 2021-08-11 NOTE — Progress Notes (Signed)
PT Cancellation Note  Patient Details Name: Casey Wilson MRN: [id number] DOB: August 15, 1976   Cancelled Treatment:    Reason Eval/Treat Not Completed: Medical issues which prohibited therapy Pt awaiting brace. Will reattempt as soon as pt receives brace.   Cindee Salt, DPT  Acute Rehabilitation Services  Pager: [phone] Office: [phone]    Lehman Prom 08/11/2021, 11:26 AM

## 2021-08-11 NOTE — Anesthesia Procedure Notes (Signed)
Anesthesia Regional Block: Supraclavicular block   Pre-Anesthetic Checklist: , timeout performed,  Correct Patient, Correct Site, Correct Laterality,  Correct Procedure, Correct Position, site marked,  Risks and benefits discussed,  Surgical consent,  Pre-op evaluation,  At surgeon's request and post-op pain management  Laterality: Left  Prep: chloraprep       Needles:  Injection technique: Single-shot  Needle Type: Echogenic Needle     Needle Length: 9cm      Additional Needles:   Procedures:,,,, ultrasound used (permanent image in chart),,    Narrative:  Start time: 08/11/2021 2:01 PM End time: 08/11/2021 2:13 PM Injection made incrementally with aspirations every 5 mL.  Performed by: Personally  Anesthesiologist: Eilene Ghazi, MD  Additional Notes: Patient tolerated the procedure well without complications  On inspiration it was very difficult to get a good view. Location of SCA would move 2 cm between inspiration and expiration
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
## 2021-08-11 NOTE — Progress Notes (Signed)
Orthopedic Tech Progress Note Patient Details:  Casey Wilson 1976-10-29 [id number]  Patient ID: Casey Wilson, male   DOB: 1976-10-02, 45 y.o.   MRN: [id number]  Casey Wilson 08/11/2021, 7:08 AM
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
## 2021-08-12 DIAGNOSIS — S52372A Galeazzi's fracture of left radius, initial encounter for closed fracture: Secondary | ICD-10-CM | POA: Diagnosis present

## 2021-08-12 DIAGNOSIS — M4856XA Collapsed vertebra, not elsewhere classified, lumbar region, initial encounter for fracture: Secondary | ICD-10-CM | POA: Diagnosis present

## 2021-08-12 DIAGNOSIS — Z885 Allergy status to narcotic agent status: Secondary | ICD-10-CM | POA: Diagnosis not present

## 2021-08-12 DIAGNOSIS — M4854XA Collapsed vertebra, not elsewhere classified, thoracic region, initial encounter for fracture: Secondary | ICD-10-CM | POA: Diagnosis present

## 2021-08-12 DIAGNOSIS — Y92410 Unspecified street and highway as the place of occurrence of the external cause: Secondary | ICD-10-CM | POA: Diagnosis not present

## 2021-08-12 DIAGNOSIS — S43102A Unspecified dislocation of left acromioclavicular joint, initial encounter: Secondary | ICD-10-CM | POA: Diagnosis present

## 2021-08-12 DIAGNOSIS — E785 Hyperlipidemia, unspecified: Secondary | ICD-10-CM | POA: Diagnosis present

## 2021-08-12 DIAGNOSIS — S71112A Laceration without foreign body, left thigh, initial encounter: Secondary | ICD-10-CM | POA: Diagnosis present

## 2021-08-12 DIAGNOSIS — Z20822 Contact with and (suspected) exposure to covid-19: Secondary | ICD-10-CM | POA: Diagnosis present

## 2021-08-12 LAB — BASIC METABOLIC PANEL
Anion gap: 5 (ref 5–15)
BUN: 11 mg/dL (ref 6–20)
CO2: 23 mmol/L (ref 22–32)
Calcium: 8.4 mg/dL — ABNORMAL LOW (ref 8.9–10.3)
Chloride: 104 mmol/L (ref 98–111)
Creatinine, Ser: 0.84 mg/dL (ref 0.61–1.24)
GFR, Estimated: >60 mL/min (ref >60)
Glucose, Bld: 152 mg/dL — ABNORMAL HIGH (ref 70–99)
Potassium: 4.1 mmol/L (ref 3.5–5.1)
Sodium: 132 mmol/L — ABNORMAL LOW (ref 135–145)

## 2021-08-12 LAB — CBC
HCT: 40.2 % (ref 39.0–52.0)
Hemoglobin: 13.5 g/dL (ref 13.0–17.0)
MCH: 30.5 pg (ref 26.0–34.0)
MCHC: 33.6 g/dL (ref 30.0–36.0)
MCV: 91 fL (ref 80.0–100.0)
Platelets: 216 10*3/uL (ref 150–400)
RBC: 4.42 MIL/uL (ref 4.22–5.81)
RDW: 13.3 % (ref 11.5–15.5)
WBC: 9.9 10*3/uL (ref 4.0–10.5)
nRBC: 0 % (ref 0.0–0.2)

## 2021-08-12 MED ORDER — CHLORHEXIDINE GLUCONATE CLOTH 2 % EX PADS
6.0000 | MEDICATED_PAD | Freq: Every day | CUTANEOUS | Status: DC
Start: 2021-08-12 — End: 2021-08-15
  Administered 2021-08-12 – 2021-08-15 (×4): 6 via TOPICAL

## 2021-08-12 MED ORDER — ACETAMINOPHEN 500 MG PO TABS: 1000.0000 mg | ORAL_TABLET | Freq: Four times a day (QID) | ORAL | Status: DC | PRN

## 2021-08-12 MED ORDER — SODIUM CHLORIDE 0.9 % IV SOLN
25.0000 mg | Freq: Four times a day (QID) | INTRAVENOUS | Status: DC | PRN
  Administered 2021-08-12: 25 mg via INTRAVENOUS
  Filled 2021-08-12 (×2): qty 1

## 2021-08-12 MED ORDER — KETOROLAC TROMETHAMINE 15 MG/ML IJ SOLN
15.0000 mg | Freq: Four times a day (QID) | INTRAMUSCULAR | Status: DC
  Administered 2021-08-12 – 2021-08-14 (×9): 15 mg via INTRAVENOUS
  Filled 2021-08-12 (×9): qty 1

## 2021-08-12 MED ORDER — HYDROMORPHONE HCL 1 MG/ML IJ SOLN: 1.0000 mg | INTRAMUSCULAR | Status: DC | PRN

## 2021-08-12 MED ORDER — MUPIROCIN 2 % EX OINT
1.0000 "application " | TOPICAL_OINTMENT | Freq: Two times a day (BID) | CUTANEOUS | Status: DC
  Administered 2021-08-12 – 2021-08-15 (×8): 1 via NASAL
  Filled 2021-08-12: qty 22

## 2021-08-12 MED ORDER — POLYETHYLENE GLYCOL 3350 17 G PO PACK
17.0000 g | PACK | Freq: Every day | ORAL | Status: DC
  Administered 2021-08-12 – 2021-08-13 (×2): 17 g via ORAL
  Filled 2021-08-12 (×2): qty 1
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
## 2021-08-12 NOTE — TOC CAGE-AID Note (Addendum)
Transition of Care [facility]) - CAGE-AID Screening   Patient Details  Name: Suleiman Finigan MRN: [id number] Date of Birth: 1976-02-21  Transition of Care [facility]) CM/SW Contact:    Lossie Faes Tarpley-Carter, LCSWA Phone Number: 08/12/2021, 3:37 PM   Clinical Narrative: Pt participated in Cage-Aid.  Pt stated he does not use substance or ETOH.  Pt was not offered resources, due to no usage of substance or ETOH.    Caren Garske Tarpley-Carter, MSW, LCSW-A Pronouns:  She/Her/Hers [facility] Clinical Social Worker Direct Number:  [phone] [email or web address]     CAGE-AID Screening:    Have You Ever Felt You Ought to Cut Down on Your Drinking or Drug Use?: No Have People Annoyed You By [employer] Your Drinking Or Drug Use?: No Have You Felt Bad Or Guilty About Your Drinking Or Drug Use?: No Have You Ever Had a Drink or Used Drugs First Thing In The Morning to Steady Your Nerves or to Get Rid of a Hangover?: No CAGE-AID Score: 0  Substance Abuse Education Offered: No

## 2021-08-12 NOTE — Plan of Care (Signed)
  Problem: Education: Goal: Knowledge of General Education information will improve Description: Including pain rating scale, medication(s)/side effects and non-pharmacologic comfort measures Outcome: Progressing   Problem: Health Behavior/Discharge Planning: Goal: Ability to manage health-related needs will improve Outcome: Progressing   Problem: Clinical Measurements: Goal: Diagnostic test results will improve Outcome: Progressing   

## 2021-08-12 NOTE — Anesthesia Postprocedure Evaluation (Signed)
Anesthesia Post Note  Patient: Casey Wilson  Procedure(Wilson) Performed: OPEN REDUCTION INTERNAL FIXATION (ORIF) DISTAL RADIUS POSSIBLE PINNING DRUJ, POSSIBLE TRIANGULAR FIBER CARTILAGE COMPLEX REPAIR (Left: Wrist)     Patient location during evaluation: PACU Anesthesia Type: General Level of consciousness: awake and alert Pain management: pain level controlled Vital Signs Assessment: post-procedure vital signs reviewed and stable Respiratory status: spontaneous breathing, nonlabored ventilation, respiratory function stable and patient connected to nasal cannula oxygen Cardiovascular status: blood pressure returned to baseline and stable Postop Assessment: no apparent nausea or vomiting Anesthetic complications: no   No notable events documented.  Last Vitals:  Vitals:   08/12/21 0445 08/12/21 0802  BP: 109/78 113/68  Pulse: 81 72  Resp: 17 14  Temp: 36.7 C 36.9 C  SpO2: 91% 94%    Last Pain:  Vitals:   08/12/21 0802  TempSrc: Oral  PainSc:                  Casey Wilson     

## 2021-08-12 NOTE — Anesthesia Postprocedure Evaluation (Signed)
Anesthesia Post Note  Patient: Casey Wilson  Procedure(s) Performed: OPEN REDUCTION INTERNAL FIXATION (ORIF) DISTAL RADIUS POSSIBLE PINNING DRUJ, POSSIBLE TRIANGULAR FIBER CARTILAGE COMPLEX REPAIR (Left: Wrist)     Patient location during evaluation: PACU Anesthesia Type: General Level of consciousness: awake and alert Pain management: pain level controlled Vital Signs Assessment: post-procedure vital signs reviewed and stable Respiratory status: spontaneous breathing, nonlabored ventilation, respiratory function stable and patient connected to nasal cannula oxygen Cardiovascular status: blood pressure returned to baseline and stable Postop Assessment: no apparent nausea or vomiting Anesthetic complications: no   No notable events documented.  Last Vitals:  Vitals:   08/12/21 0445 08/12/21 0802  BP: 109/78 113/68  Pulse: 81 72  Resp: 17 14  Temp: 36.7 C 36.9 C  SpO2: 91% 94%    Last Pain:  Vitals:   08/12/21 0802  TempSrc: Oral  PainSc:                  Dustee Bottenfield S

## 2021-08-13 ENCOUNTER — Encounter (HOSPITAL_COMMUNITY): Payer: Self-pay | Admitting: Orthopedic Surgery

## 2021-08-13 ENCOUNTER — Encounter (INDEPENDENT_AMBULATORY_CARE_PROVIDER_SITE_OTHER): Payer: Self-pay

## 2021-08-13 LAB — HIV ANTIBODY (ROUTINE TESTING W REFLEX): HIV Screen 4th Generation wRfx: NONREACTIVE

## 2021-08-13 MED ORDER — SODIUM CHLORIDE 0.9% FLUSH
3.0000 mL | Freq: Two times a day (BID) | INTRAVENOUS | Status: DC
  Administered 2021-08-13 – 2021-08-15 (×4): 3 mL via INTRAVENOUS

## 2021-08-13 MED ORDER — SODIUM CHLORIDE 0.9 % IV SOLN: 250.0000 mL | INTRAVENOUS | Status: DC | PRN

## 2021-08-13 MED ORDER — SODIUM CHLORIDE 0.9% FLUSH: 3.0000 mL | INTRAVENOUS | Status: DC | PRN
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
## 2021-08-13 NOTE — Care Management (Signed)
PT recommends outpatient PT, placed order for referral to OP rehab on [address]

## 2021-08-13 NOTE — Progress Notes (Signed)
Physical Therapy Discharge Patient Details Name: Casey Wilson MRN: [id number] DOB: May 22, 1976 Today's Date: 08/13/2021 Time: 0100-0115 PT Time Calculation (min) (ACUTE ONLY): 15 min  Patient discharged from PT services secondary to goals met and no further PT needs identified.  Please see latest therapy progress note for current level of functioning and progress toward goals.    Progress and discharge plan discussed with patient and/or caregiver: Patient/Caregiver agrees with plan  GP     [name] A. Yaqub Arney, PT, DPT Acute Rehabilitation Services Office: [place] 08/13/2021, 1:31 PM
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
## 2021-08-13 NOTE — Plan of Care (Signed)
  Problem: Education: Goal: Knowledge of General Education information will improve Description: Including pain rating scale, medication(s)/side effects and non-pharmacologic comfort measures Outcome: Progressing   Problem: Health Behavior/Discharge Planning: Goal: Ability to manage health-related needs will improve Outcome: Progressing   Problem: Clinical Measurements: Goal: Ability to maintain clinical measurements within normal limits will improve Outcome: Progressing Goal: Will remain free from infection Outcome: Progressing Goal: Diagnostic test results will improve Outcome: Progressing   Problem: Activity: Goal: Risk for activity intolerance will decrease Outcome: Progressing   Problem: Nutrition: Goal: Adequate nutrition will be maintained Outcome: Progressing   Problem: Coping: Goal: Level of anxiety will decrease Outcome: Progressing   Problem: Elimination: Goal: Will not experience complications related to bowel motility Outcome: Progressing Goal: Will not experience complications related to urinary retention Outcome: Progressing   Problem: Pain Managment: Goal: General experience of comfort will improve Outcome: Progressing   Problem: Safety: Goal: Ability to remain free from injury will improve Outcome: Progressing   Problem: Skin Integrity: Goal: Risk for impaired skin integrity will decrease Outcome: Progressing   Report received from previous shift. VS obtained, shift assessments completed - see flowsheets. Scheduled pain medications given as ordered, patient denies need for PRN medications - see MAR. OOB with standby assist and TLSO brace, tolerating activity well with steady gait. L arm sling in place at all times. Dressing to LUE CDI. L thigh and L great toe dressing also remain CDI. Patient currently resting in bed, bed in lowest position. Denies needs. Call bell within reach.

## 2021-08-13 NOTE — Progress Notes (Signed)
Inpatient Rehab Admissions Coordinator:  Consult received. However, PT has discharged pt from their services and updated d/c recommendations to outpatient. Pt no longer requires the intensity of CIR.  Will sign off.    Wolfgang Phoenix, MS, CCC-SLP Admissions Coordinator [phone]

## 2021-08-14 MED ORDER — KETOROLAC TROMETHAMINE 30 MG/ML IJ SOLN
30.0000 mg | Freq: Four times a day (QID) | INTRAMUSCULAR | Status: DC
  Administered 2021-08-14 – 2021-08-15 (×4): 30 mg via INTRAVENOUS
  Filled 2021-08-14 (×4): qty 1

## 2021-08-14 MED ORDER — POLYETHYLENE GLYCOL 3350 17 G PO PACK
17.0000 g | PACK | Freq: Two times a day (BID) | ORAL | Status: DC
  Administered 2021-08-14: 17 g via ORAL
  Filled 2021-08-14 (×3): qty 1

## 2021-08-14 NOTE — Plan of Care (Signed)
  Problem: Education: Goal: Knowledge of General Education information will improve Description: Including pain rating scale, medication(s)/side effects and non-pharmacologic comfort measures Outcome: Progressing   Problem: Health Behavior/Discharge Planning: Goal: Ability to manage health-related needs will improve Outcome: Progressing   Problem: Clinical Measurements: Goal: Ability to maintain clinical measurements within normal limits will improve Outcome: Progressing Goal: Will remain free from infection Outcome: Progressing Goal: Diagnostic test results will improve Outcome: Progressing   Problem: Activity: Goal: Risk for activity intolerance will decrease Outcome: Progressing   Problem: Nutrition: Goal: Adequate nutrition will be maintained Outcome: Progressing   Problem: Coping: Goal: Level of anxiety will decrease Outcome: Progressing   Problem: Elimination: Goal: Will not experience complications related to bowel motility Outcome: Not Progressing Goal: Will not experience complications related to urinary retention Outcome: Progressing   Problem: Pain Managment: Goal: General experience of comfort will improve Outcome: Progressing   Problem: Safety: Goal: Ability to remain free from injury will improve Outcome: Progressing   Problem: Skin Integrity: Goal: Risk for impaired skin integrity will decrease Outcome: Progressing   Report received and care assumed from previous shift. Progressing towards goals as outlined above. VS obtained, shift assessments completed - see flowsheets. PRN pain medications given as needed - see MAR. OOB independently with TLSO brace, NWB L UE (sling in use at all times) - tolerating activity well with steady gait. L UE, L thigh, L toe dressing remains CDI. Patient currently resting in bed, bed in lowest position. Denies needs. Call bell within reach.

## 2021-08-14 NOTE — Plan of Care (Signed)
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
## 2021-08-15 LAB — CBC
HCT: 37 % — ABNORMAL LOW (ref 39.0–52.0)
Hemoglobin: 12.3 g/dL — ABNORMAL LOW (ref 13.0–17.0)
MCH: 30.6 pg (ref 26.0–34.0)
MCHC: 33.2 g/dL (ref 30.0–36.0)
MCV: 92 fL (ref 80.0–100.0)
Platelets: 187 10*3/uL (ref 150–400)
RBC: 4.02 MIL/uL — ABNORMAL LOW (ref 4.22–5.81)
RDW: 13.2 % (ref 11.5–15.5)
WBC: 7.6 10*3/uL (ref 4.0–10.5)
nRBC: 0 % (ref 0.0–0.2)

## 2021-08-15 LAB — BASIC METABOLIC PANEL
Anion gap: 6 (ref 5–15)
BUN: 11 mg/dL (ref 6–20)
CO2: 26 mmol/L (ref 22–32)
Calcium: 8.4 mg/dL — ABNORMAL LOW (ref 8.9–10.3)
Chloride: 103 mmol/L (ref 98–111)
Creatinine, Ser: 0.81 mg/dL (ref 0.61–1.24)
GFR, Estimated: >60 mL/min (ref >60)
Glucose, Bld: 95 mg/dL (ref 70–99)
Potassium: 3.7 mmol/L (ref 3.5–5.1)
Sodium: 135 mmol/L (ref 135–145)

## 2021-08-15 MED ORDER — METHOCARBAMOL 750 MG PO TABS: 750.0000 mg | ORAL_TABLET | Freq: Four times a day (QID) | ORAL | 1 refills | Status: AC | PRN

## 2021-08-15 MED ORDER — OXYCODONE HCL 10 MG PO TABS: 10.0000 mg | ORAL_TABLET | Freq: Four times a day (QID) | ORAL | 0 refills | Status: AC | PRN

## 2021-08-15 MED ORDER — POLYETHYLENE GLYCOL 3350 17 G PO PACK: 17.0000 g | PACK | Freq: Two times a day (BID) | ORAL | 0 refills | Status: AC

## 2021-08-15 MED ORDER — IBUPROFEN 200 MG PO TABS: 400.0000 mg | ORAL_TABLET | Freq: Three times a day (TID) | ORAL | Status: AC | PRN

## 2021-08-15 MED ORDER — DOCUSATE SODIUM 100 MG PO CAPS: 100.0000 mg | ORAL_CAPSULE | Freq: Two times a day (BID) | ORAL | 0 refills | Status: AC

## 2021-08-15 MED ORDER — ASCORBIC ACID 1000 MG PO TABS: 1000.0000 mg | ORAL_TABLET | Freq: Every day | ORAL | Status: AC
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
## 2021-08-15 NOTE — Plan of Care (Signed)
  Problem: Education: Goal: Knowledge of General Education information will improve Description: Including pain rating scale, medication(s)/side effects and non-pharmacologic comfort measures Outcome: Progressing   Problem: Health Behavior/Discharge Planning: Goal: Ability to manage health-related needs will improve Outcome: Progressing   Problem: Clinical Measurements: Goal: Ability to maintain clinical measurements within normal limits will improve Outcome: Progressing Goal: Will remain free from infection Outcome: Progressing Goal: Diagnostic test results will improve Outcome: Progressing   Problem: Activity: Goal: Risk for activity intolerance will decrease Outcome: Progressing   Problem: Nutrition: Goal: Adequate nutrition will be maintained Outcome: Progressing   Problem: Coping: Goal: Level of anxiety will decrease Outcome: Progressing   Problem: Elimination: Goal: Will not experience complications related to bowel motility Outcome: Progressing Goal: Will not experience complications related to urinary retention Outcome: Progressing   Problem: Pain Managment: Goal: General experience of comfort will improve Outcome: Progressing   Problem: Safety: Goal: Ability to remain free from injury will improve Outcome: Progressing   Problem: Skin Integrity: Goal: Risk for impaired skin integrity will decrease Outcome: Progressing   Report received from previous shift and care assumed. Progressing towards goals as outlined above. VS obtained, shift assessments completed - see flowsheets. PRN and scheduled pain medications given - see MAR. OOB independently with steady gait and TLSO brace, tolerating activity well. LUE, L thigh, L great toe dressing remain intact. L UE sling in place at all times. Patient currently resting in bed, bed in lowest position. Denies needs. Call bell within reach.
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
## 2021-10-03 ENCOUNTER — Other Ambulatory Visit: Payer: Self-pay

## 2021-10-03 ENCOUNTER — Ambulatory Visit (INDEPENDENT_AMBULATORY_CARE_PROVIDER_SITE_OTHER): Payer: Commercial Managed Care - PPO | Admitting: Rehabilitative and Restorative Service Providers"

## 2021-10-03 ENCOUNTER — Encounter: Payer: Self-pay | Admitting: Rehabilitative and Restorative Service Providers"

## 2021-10-03 DIAGNOSIS — R293 Abnormal posture: Secondary | ICD-10-CM

## 2021-10-03 DIAGNOSIS — M25512 Pain in left shoulder: Secondary | ICD-10-CM | POA: Diagnosis not present

## 2021-10-03 DIAGNOSIS — M6281 Muscle weakness (generalized): Secondary | ICD-10-CM

## 2021-10-03 DIAGNOSIS — R29898 Other symptoms and signs involving the musculoskeletal system: Secondary | ICD-10-CM | POA: Diagnosis not present
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
## 2021-10-03 NOTE — Patient Instructions (Signed)
Access Code: [email or web address] URL: [email or web address] Date: 10/03/2021 Prepared by: Corlis Leak  Exercises Seated Cervical Rotation AROM - 2 x daily - 7 x weekly - 1 sets - 5 reps - 2-3 sec hold Seated Cervical Sidebending AROM - 2 x daily - 7 x weekly - 1 sets - 5 reps - 5-10 sec hold Seated Scapular Retraction - 2 x daily - 7 x weekly - 1-2 sets - 10 reps - 10 sec hold  Patient Education Posture and Body Mechanics Office Posture TENS Unit

## 2021-10-06 ENCOUNTER — Other Ambulatory Visit: Payer: Self-pay

## 2021-10-06 ENCOUNTER — Encounter: Payer: Self-pay | Admitting: Rehabilitative and Restorative Service Providers"

## 2021-10-06 ENCOUNTER — Ambulatory Visit (INDEPENDENT_AMBULATORY_CARE_PROVIDER_SITE_OTHER): Payer: Commercial Managed Care - PPO | Admitting: Rehabilitative and Restorative Service Providers"

## 2021-10-06 DIAGNOSIS — R29898 Other symptoms and signs involving the musculoskeletal system: Secondary | ICD-10-CM | POA: Diagnosis not present

## 2021-10-06 DIAGNOSIS — R293 Abnormal posture: Secondary | ICD-10-CM

## 2021-10-06 DIAGNOSIS — M25512 Pain in left shoulder: Secondary | ICD-10-CM | POA: Diagnosis not present

## 2021-10-06 DIAGNOSIS — M6281 Muscle weakness (generalized): Secondary | ICD-10-CM

## 2021-10-06 NOTE — Therapy (Signed)
[facility] [place] [address] [place], [place], [address] Phone: [phone]   Fax:  [phone]  Physical Therapy Treatment  Patient Details  Name: Colbe Viviano MRN: [id number] Date of Birth: [birth year] Referring Provider (PT): Dr Ramond Marrow   Encounter Date: 10/06/2021   PT End of Session - 10/06/21 0759     Visit Number 2    Number of Visits 16    Date for PT Re-Evaluation 11/28/21    PT Start Time 0759    PT Stop Time 0848    PT Time Calculation (min) 49 min    Activity Tolerance Patient tolerated treatment well             History reviewed. No pertinent past medical history.  Past Surgical History:  Procedure Laterality Date   ACHILLES TENDON REPAIR     Both feet   ORIF WRIST FRACTURE Left 08/11/2021   Procedure: OPEN REDUCTION INTERNAL FIXATION (ORIF) DISTAL RADIUS POSSIBLE PINNING DRUJ, POSSIBLE TRIANGULAR FIBER CARTILAGE COMPLEX REPAIR;  Surgeon: Betha Loa, MD;  Location: [facility] OR;  Service: Orthopedics;  Laterality: Left;   orthognatic     jaw repair for overbite    There were no vitals filed for this visit.   Subjective Assessment - 10/06/21 0800     Subjective Patient started therapy last week for wrist and hand. Sore and swollen but making gradual progress. HEP for shoulder going well.    Currently in Pain? Yes    Pain Score 2     Pain Location Shoulder    Pain Orientation Left    Pain Descriptors / Indicators Aching;Nagging    Pain Type Acute pain;Surgical pain                [facility] PT Assessment - 10/06/21 0001       Assessment   Medical Diagnosis Lt shoulder AC repair    Referring Provider (PT) Dr Ramond Marrow    Onset Date/Surgical Date 08/18/21   injury 08/11/21   Hand Dominance Right    Next MD Visit 10/07/21    Prior Therapy splint for Lt UE; remains in sling      PROM   Overall PROM Comments pt supine    Left Shoulder Flexion 90 Degrees    Left Shoulder ABduction 90 Degrees     Left Shoulder External Rotation 25 Degrees                           [facility] Adult PT Treatment/Exercise - 10/06/21 0001       Shoulder Exercises: Isometric Strengthening   Flexion 3X5"    Extension 3X5"    External Rotation 3X5"    Internal Rotation 3X5"    ABduction 3X5"      Manual Therapy   Soft tissue mobilization through Lt shoulder girdle    Passive ROM Lt shoulder flexion to 90 deg; scaptiion to 90 deg; ER in scaption                     PT Education - 10/06/21 0813     Education Details HEP    Person(s) Educated Patient    Methods Explanation;Demonstration;Tactile cues;Verbal cues;Handout    Comprehension Verbalized understanding;Returned demonstration;Verbal cues required;Tactile cues required              PT Short Term Goals - 10/03/21 1310       PT SHORT TERM GOAL #1   Title  Complete shoulder evaluation assessing ROM Lt shoulder as indicated    Time 2    Period Weeks    Status New    Target Date 10/17/21      PT SHORT TERM GOAL #2   Title Instruct patient in initial HEP for Lt shoulder dysfunction following AC dislocation and Lt radius fx    Time 4    Period Weeks    Status New    Target Date 11/01/21               PT Long Term Goals - 10/03/21 1312       PT LONG TERM GOAL #1   Title Improve posture and alignment with patient to demonstrate improved upright posture with posterior shoulder girdle musculature engaged    Time 8    Period Weeks    Status New    Target Date 11/28/21      PT LONG TERM GOAL #2   Title Increase AROM Lt shoulder per MD protocol to allow functional use Lt UE    Time 8    Period Weeks    Status New    Target Date 11/28/21      PT LONG TERM GOAL #3   Title Increase strength Lt shoulder to 4/5 to 5/5 to prepare pt to return to work    Time 8    Period Weeks    Status New    Target Date 11/28/21      PT LONG TERM GOAL #4   Title Independent in HEP (including aquatic exercise  as indicated)    Time 8    Period Weeks    Status New    Target Date 11/28/21      PT LONG TERM GOAL #5   Title Improve functional limitation score to 64    Time 8    Period Weeks    Status New    Target Date 11/28/21                   Plan - 10/06/21 0806     Clinical Impression Statement Working on HEP without difficulty. Still having stiffness, swelling and some pain. Added PROM and isometric ex for shoulder today. Started manual work and PROM per MD protocol.    Rehab Potential Good    PT Frequency 2x / week    PT Duration 8 weeks    PT Treatment/Interventions ADLs/Self Care Home Management;Aquatic Therapy;Cryotherapy;Electrical Stimulation;Iontophoresis 4mg /ml Dexamethasone;Moist Heat;Ultrasound;Functional mobility training;Therapeutic activities;Therapeutic exercise;Neuromuscular re-education;Patient/family education;Manual techniques;Scar mobilization;Passive range of motion;Dry needling;Vasopneumatic Device    PT Home Exercise Plan [email or web address]    Consulted and Agree with Plan of Care Patient             Patient will benefit from skilled therapeutic intervention in order to improve the following deficits and impairments:     Visit Diagnosis: Acute pain of left shoulder  Other symptoms and signs involving the musculoskeletal system  Abnormal posture  Muscle weakness (generalized)     Problem List Patient Active Problem List   Diagnosis Date Noted   Left wrist fracture 08/11/2021    Prabhleen Montemayor 10/11/2021, PT, MPH  10/06/2021, 8:47 AM  [facility] [address] [place], [place], [place] Phone: [phone]   Fax:  [phone]  Name: Darnelle Corp MRN: Blima Rich Date of Birth: 1975/11/24

## 2021-10-06 NOTE — Patient Instructions (Signed)
Access Code: [email or web address] URL: [email or web address] Date: 10/06/2021 Prepared by: Corlis Leak  Exercises Seated Cervical Rotation AROM - 2 x daily - 7 x weekly - 1 sets - 5 reps - 2-3 sec hold Seated Cervical Sidebending AROM - 2 x daily - 7 x weekly - 1 sets - 5 reps - 5-10 sec hold Seated Scapular Retraction - 2 x daily - 7 x weekly - 1-2 sets - 10 reps - 10 sec hold Circular Shoulder Pendulum with Table Support - 3-4 x daily - 7 x weekly - 1 sets - 20-30 reps Isometric Shoulder Extension at Wall - 2 x daily - 7 x weekly - 1 sets - 5-10 reps - 3-5 sec hold Isometric Shoulder Abduction at Wall - 2 x daily - 7 x weekly - 1 sets - 5-10 reps - 3-5 sec hold Isometric Shoulder External Rotation at Wall - 2 x daily - 7 x weekly - 1 sets - 5 reps - 3-5 sec hold Isometric Shoulder Flexion at Wall - 2 x daily - 7 x weekly - 1 sets - 5-10 reps - 3-5 sec hold Isometric Shoulder Internal Rotation - 2 x daily - 7 x weekly - 1 sets - 5-10 reps - 3-5 sec hold

## 2021-10-11 ENCOUNTER — Encounter: Payer: Commercial Managed Care - PPO | Admitting: Physical Therapy

## 2021-10-12 ENCOUNTER — Other Ambulatory Visit: Payer: Self-pay

## 2021-10-12 ENCOUNTER — Ambulatory Visit (INDEPENDENT_AMBULATORY_CARE_PROVIDER_SITE_OTHER): Payer: Commercial Managed Care - PPO | Admitting: Physical Therapy

## 2021-10-12 ENCOUNTER — Encounter: Payer: Self-pay | Admitting: Physical Therapy

## 2021-10-12 DIAGNOSIS — R293 Abnormal posture: Secondary | ICD-10-CM

## 2021-10-12 DIAGNOSIS — M25512 Pain in left shoulder: Secondary | ICD-10-CM

## 2021-10-12 DIAGNOSIS — R29898 Other symptoms and signs involving the musculoskeletal system: Secondary | ICD-10-CM

## 2021-10-12 DIAGNOSIS — M6281 Muscle weakness (generalized): Secondary | ICD-10-CM

## 2021-10-12 NOTE — Therapy (Signed)
[facility] Outpatient Rehabilitation [place] [address] [place] [address] [place], [place], [address] Phone: [phone]   Fax:  [phone]  Physical Therapy Treatment  Patient Details  Name: Baraka Klatt MRN: [id number] Date of Birth: 1976/10/15 Referring Provider (PT): Dr Ramond Marrow   Encounter Date: 10/12/2021   PT End of Session - 10/12/21 1447     Visit Number 3    Number of Visits 16    Date for PT Re-Evaluation 11/28/21    PT Start Time 1435    PT Stop Time 1518    PT Time Calculation (min) 43 min    Activity Tolerance Patient tolerated treatment well    Behavior During Therapy [facility] for tasks assessed/performed             History reviewed. No pertinent past medical history.  Past Surgical History:  Procedure Laterality Date   ACHILLES TENDON REPAIR     Both feet   ORIF WRIST FRACTURE Left 08/11/2021   Procedure: OPEN REDUCTION INTERNAL FIXATION (ORIF) DISTAL RADIUS POSSIBLE PINNING DRUJ, POSSIBLE TRIANGULAR FIBER CARTILAGE COMPLEX REPAIR;  Surgeon: Betha Loa, MD;  Location: [facility] OR;  Service: Orthopedics;  Laterality: Left;   orthognatic     jaw repair for overbite    There were no vitals filed for this visit.   Subjective Assessment - 10/12/21 1441     Subjective Pt reports he saw the surgeon and had an injection in shoulder.  He is out of sling and has a 5# restriction.    Currently in Pain? Yes    Pain Score 3     Pain Location Shoulder    Pain Orientation Left;Anterior    Pain Descriptors / Indicators Tightness;Dull    Aggravating Factors  not using arm.    Pain Relieving Factors heat, ice                [facility] PT Assessment - 10/12/21 0001       Assessment   Medical Diagnosis Lt shoulder AC repair    Referring Provider (PT) Dr Ramond Marrow    Onset Date/Surgical Date 08/18/21   injury 08/11/21   Hand Dominance Right    Next MD Visit 11/18/20    Prior Therapy .      PROM   Left Shoulder Flexion 120 Degrees   supine AAROM  with cane   Left Shoulder External Rotation 30 Degrees   AAROM in supported scaption             [facility] Adult PT Treatment/Exercise - 10/12/21 0001       Shoulder Exercises: Supine   External Rotation AAROM;Both;10 reps   supported supine   Flexion AAROM;Both;10 reps   bench press     Shoulder Exercises: Standing   Internal Rotation AAROM;10 reps   cane; 2 versions   Extension AAROM;Both;10 reps    Other Standing Exercises scap squeeze with noodle 10 sec x 5 reps      Shoulder Exercises: Pulleys   Flexion --   10 reps, 5 sec hold   Scaption --   10 reps, 5 sec hold     Shoulder Exercises: Isometric Strengthening   Flexion 5X5"    Extension 5X5"      Modalities   Modalities Vasopneumatic      Vasopneumatic   Number Minutes Vasopneumatic  10 minutes    Vasopnuematic Location  Shoulder   L   Vasopneumatic Pressure Low    Vasopneumatic Temperature  34  PT Short Term Goals - 10/12/21 1526       PT SHORT TERM GOAL #1   Title Complete shoulder evaluation assessing ROM Lt shoulder as indicated    Time 2    Period Weeks    Status Achieved    Target Date 10/17/21      PT SHORT TERM GOAL #2   Title Instruct patient in initial HEP for Lt shoulder dysfunction following AC dislocation and Lt radius fx    Time 4    Period Weeks    Status Achieved    Target Date 11/01/21               PT Long Term Goals - 10/03/21 1312       PT LONG TERM GOAL #1   Title Improve posture and alignment with patient to demonstrate improved upright posture with posterior shoulder girdle musculature engaged    Time 8    Period Weeks    Status New    Target Date 11/28/21      PT LONG TERM GOAL #2   Title Increase AROM Lt shoulder per MD protocol to allow functional use Lt UE    Time 8    Period Weeks    Status New    Target Date 11/28/21      PT LONG TERM GOAL #3   Title Increase strength Lt shoulder to 4/5 to 5/5 to prepare pt to return to work    Time 8     Period Weeks    Status New    Target Date 11/28/21      PT LONG TERM GOAL #4   Title Independent in HEP (including aquatic exercise as indicated)    Time 8    Period Weeks    Status New    Target Date 11/28/21      PT LONG TERM GOAL #5   Title Improve functional limitation score to 64    Time 8    Period Weeks    Status New    Target Date 11/28/21                   Plan - 10/12/21 1505     Clinical Impression Statement Pt tolerated AAROM exercises well (per rehab protocol), reporting reduction of tightness in Lt shoulder afterwards. Pt moves to next phase tomorrow (will be 8 wks post op). Goals are ongoing.    Rehab Potential Good    PT Frequency 2x / week    PT Duration 8 weeks    PT Treatment/Interventions ADLs/Self Care Home Management;Aquatic Therapy;Cryotherapy;Electrical Stimulation;Iontophoresis 4mg /ml Dexamethasone;Moist Heat;Ultrasound;Functional mobility training;Therapeutic activities;Therapeutic exercise;Neuromuscular re-education;Patient/family education;Manual techniques;Scar mobilization;Passive range of motion;Dry needling;Vasopneumatic Device    PT Next Visit Plan continue progressive exercises per rehab protocol.    PT Home Exercise Plan [id number]    Consulted and Agree with Plan of Care Patient             Patient will benefit from skilled therapeutic intervention in order to improve the following deficits and impairments:  Decreased range of motion, Increased fascial restricitons, Impaired UE functional use, Decreased activity tolerance, Pain, Impaired flexibility, Improper body mechanics, Decreased mobility, Decreased strength, Increased edema, Postural dysfunction  Visit Diagnosis: Acute pain of left shoulder  Other symptoms and signs involving the musculoskeletal system  Abnormal posture  Muscle weakness (generalized)     Problem List Patient Active Problem List   Diagnosis Date Noted   Left wrist fracture 08/11/2021    10/11/2021,  PTA 10/12/21 3:27 PM   [facility] [address] [place] [address] [place], [place], [address] Phone: [phone]   Fax:  [phone]  Name: Erwin Nishiyama MRN: [id number] Date of Birth: 1976/05/26

## 2021-10-14 ENCOUNTER — Ambulatory Visit (INDEPENDENT_AMBULATORY_CARE_PROVIDER_SITE_OTHER): Payer: Commercial Managed Care - PPO | Admitting: Rehabilitative and Restorative Service Providers"

## 2021-10-14 ENCOUNTER — Other Ambulatory Visit: Payer: Self-pay

## 2021-10-14 ENCOUNTER — Encounter: Payer: Self-pay | Admitting: Rehabilitative and Restorative Service Providers"

## 2021-10-14 DIAGNOSIS — M6281 Muscle weakness (generalized): Secondary | ICD-10-CM

## 2021-10-14 DIAGNOSIS — R293 Abnormal posture: Secondary | ICD-10-CM

## 2021-10-14 DIAGNOSIS — M25512 Pain in left shoulder: Secondary | ICD-10-CM

## 2021-10-14 DIAGNOSIS — R29898 Other symptoms and signs involving the musculoskeletal system: Secondary | ICD-10-CM

## 2021-10-14 NOTE — Therapy (Signed)
[facility] Outpatient Rehabilitation [place] [address] [place] [address] [place], [place], [address] Phone: [phone]   Fax:  [phone]  Physical Therapy Treatment  Patient Details  Name: Casey Wilson MRN: [id number] Date of Birth: [birth year] Referring Provider (PT): Dr Ramond Marrow   Encounter Date: 10/14/2021   PT End of Session - 10/14/21 0758     Visit Number 4    Number of Visits 16    Date for PT Re-Evaluation 11/28/21    PT Start Time 0759    PT Stop Time 0847    PT Time Calculation (min) 48 min    Activity Tolerance Patient tolerated treatment well             History reviewed. No pertinent past medical history.  Past Surgical History:  Procedure Laterality Date   ACHILLES TENDON REPAIR     Both feet   ORIF WRIST FRACTURE Left 08/11/2021   Procedure: OPEN REDUCTION INTERNAL FIXATION (ORIF) DISTAL RADIUS POSSIBLE PINNING DRUJ, POSSIBLE TRIANGULAR FIBER CARTILAGE COMPLEX REPAIR;  Surgeon: Betha Loa, MD;  Location: [facility] OR;  Service: Orthopedics;  Laterality: Left;   orthognatic     jaw repair for overbite    There were no vitals filed for this visit.   Subjective Assessment - 10/14/21 0802     Subjective Patient reports that he is doing well and having no pain. No problems with new exercises.    Pain Score 0-No pain    Pain Location Shoulder    Pain Orientation Left                               [facility] Adult PT Treatment/Exercise - 10/14/21 0001       Shoulder Exercises: Supine   Flexion AAROM;Both;10 reps   with cane to 90 deg   Flexion Limitations bench press with cane x 10 reps    Other Supine Exercises scap squeeze 10 sec x 10 reps      Shoulder Exercises: Standing   Internal Rotation AAROM;10 reps   with cane   Extension AAROM;Both;10 reps   with cane   Other Standing Exercises scap squeeze with noodle 10 sec x 5 reps      Shoulder Exercises: Pulleys   Flexion Limitations 10 reps x 10 sec hold    Scaption  Limitations 10 reps x 10 sec hold      Shoulder Exercises: Isometric Strengthening   Flexion 5X5"    Extension 5X5"    External Rotation 5X5"    Internal Rotation 5X5"    ABduction 5X5"      Shoulder Exercises: Stretch   External Rotation Stretch --   10 reps 5 sec hold standing noodle along spine elbow 90 deg UE at side     Cryotherapy   Number Minutes Cryotherapy 10 Minutes    Cryotherapy Location Shoulder    Type of Cryotherapy Ice pack      Vasopneumatic   Number Minutes Vasopneumatic  1 minutes    Vasopnuematic Location  Shoulder   L   Vasopneumatic Pressure Low    Vasopneumatic Temperature  34      Manual Therapy   Soft tissue mobilization Lt shoulder girdle    Passive ROM Lt shoulder flexion to 90 deg; scaption to 90 deg; ER in scaption                       PT Short Term Goals -  10/12/21 1526       PT SHORT TERM GOAL #1   Title Complete shoulder evaluation assessing ROM Lt shoulder as indicated    Time 2    Period Weeks    Status Achieved    Target Date 10/17/21      PT SHORT TERM GOAL #2   Title Instruct patient in initial HEP for Lt shoulder dysfunction following AC dislocation and Lt radius fx    Time 4    Period Weeks    Status Achieved    Target Date 11/01/21               PT Long Term Goals - 10/03/21 1312       PT LONG TERM GOAL #1   Title Improve posture and alignment with patient to demonstrate improved upright posture with posterior shoulder girdle musculature engaged    Time 8    Period Weeks    Status New    Target Date 11/28/21      PT LONG TERM GOAL #2   Title Increase AROM Lt shoulder per MD protocol to allow functional use Lt UE    Time 8    Period Weeks    Status New    Target Date 11/28/21      PT LONG TERM GOAL #3   Title Increase strength Lt shoulder to 4/5 to 5/5 to prepare pt to return to work    Time 8    Period Weeks    Status New    Target Date 11/28/21      PT LONG TERM GOAL #4   Title  Independent in HEP (including aquatic exercise as indicated)    Time 8    Period Weeks    Status New    Target Date 11/28/21      PT LONG TERM GOAL #5   Title Improve functional limitation score to 64    Time 8    Period Weeks    Status New    Target Date 11/28/21                   Plan - 10/14/21 0805     Clinical Impression Statement Continue with AAROM exercises and added isometric strengthening without difficulty. Continue with PROM and manual work.    Rehab Potential Good    PT Frequency 2x / week    PT Duration 8 weeks    PT Treatment/Interventions ADLs/Self Care Home Management;Aquatic Therapy;Cryotherapy;Electrical Stimulation;Iontophoresis 4mg /ml Dexamethasone;Moist Heat;Ultrasound;Functional mobility training;Therapeutic activities;Therapeutic exercise;Neuromuscular re-education;Patient/family education;Manual techniques;Scar mobilization;Passive range of motion;Dry needling;Vasopneumatic Device    PT Next Visit Plan continue progressive exercises per rehab protocol.    PT Home Exercise Plan [id number]    Consulted and Agree with Plan of Care Patient             Patient will benefit from skilled therapeutic intervention in order to improve the following deficits and impairments:     Visit Diagnosis: Acute pain of left shoulder  Other symptoms and signs involving the musculoskeletal system  Abnormal posture  Muscle weakness (generalized)     Problem List Patient Active Problem List   Diagnosis Date Noted   Left wrist fracture 08/11/2021    Casey Wilson 10/11/2021, PT, MPH  10/14/2021, 8:46 AM  [facility] [address] [place] [address] [place], [place], [place] Phone: [phone]   Fax:  [phone]  Name: Casey Wilson MRN: Blima Rich Date of Birth: [birth year]

## 2021-10-18 ENCOUNTER — Other Ambulatory Visit: Payer: Self-pay

## 2021-10-18 ENCOUNTER — Encounter: Payer: Self-pay | Admitting: Physical Therapy

## 2021-10-18 ENCOUNTER — Ambulatory Visit (INDEPENDENT_AMBULATORY_CARE_PROVIDER_SITE_OTHER): Payer: Commercial Managed Care - PPO | Admitting: Physical Therapy

## 2021-10-18 DIAGNOSIS — M25512 Pain in left shoulder: Secondary | ICD-10-CM

## 2021-10-18 DIAGNOSIS — R293 Abnormal posture: Secondary | ICD-10-CM

## 2021-10-18 DIAGNOSIS — M6281 Muscle weakness (generalized): Secondary | ICD-10-CM

## 2021-10-18 DIAGNOSIS — R29898 Other symptoms and signs involving the musculoskeletal system: Secondary | ICD-10-CM

## 2021-10-18 NOTE — Therapy (Signed)
[facility] Outpatient Rehabilitation [place] [address] [place] [address] [place], [place], [address] Phone: [phone]   Fax:  [phone]  Physical Therapy Treatment  Patient Details  Name: Casey Wilson MRN: [id number] Date of Birth: 1976/05/03 Referring Provider (PT): Dr Ramond Marrow   Encounter Date: 10/18/2021   PT End of Session - 10/18/21 0837     Visit Number 5    Number of Visits 16    Date for PT Re-Evaluation 11/28/21    PT Start Time 0834    PT Stop Time 0920    PT Time Calculation (min) 46 min    Activity Tolerance Patient tolerated treatment well;No increased pain    Behavior During Therapy [facility] for tasks assessed/performed             History reviewed. No pertinent past medical history.  Past Surgical History:  Procedure Laterality Date   ACHILLES TENDON REPAIR     Both feet   ORIF WRIST FRACTURE Left 08/11/2021   Procedure: OPEN REDUCTION INTERNAL FIXATION (ORIF) DISTAL RADIUS POSSIBLE PINNING DRUJ, POSSIBLE TRIANGULAR FIBER CARTILAGE COMPLEX REPAIR;  Surgeon: Betha Loa, MD;  Location: [facility] OR;  Service: Orthopedics;  Laterality: Left;   orthognatic     jaw repair for overbite    There were no vitals filed for this visit.   Subjective Assessment - 10/18/21 0836     Subjective Pt complains of stiffness in anterior portion of Lt shoulder.  Biggest complaint is lack of supination in forearm.    Patient Stated Goals get shoulder working again so he can return to normal function    Currently in Pain? No/denies    Pain Score 0-No pain                [facility] PT Assessment - 10/18/21 0001       Assessment   Medical Diagnosis Lt shoulder AC repair    Referring Provider (PT) Dr Ramond Marrow    Onset Date/Surgical Date 08/18/21   injury 08/11/21   Hand Dominance Right    Next MD Visit 11/18/20    Prior Therapy .              [facility] Adult PT Treatment/Exercise - 10/18/21 0001       Shoulder Exercises: Supine   External Rotation  AAROM;Left;10 reps - in scapular plane   Flexion AAROM;Both;5 reps   Star gazers stretch with back of palm to forehead x 15 sec x 2 reps.      Shoulder Exercises: Standing   Internal Rotation AAROM;10 reps   with cane,  2 sets - 2 variations   Extension AAROM;Both;10 reps   with cane   Other Standing Exercises wall ladder x 2 reps in flexion, 2 reps in scaption.      Shoulder Exercises: Pulleys   Flexion Limitations 10 reps x 10 sec hold    Scaption Limitations 10 reps x 10 sec hold      Vasopneumatic   Number Minutes Vasopneumatic  10 minutes    Vasopnuematic Location  Shoulder   L   Vasopneumatic Pressure Medium    Vasopneumatic Temperature  34      Manual Therapy   Soft tissue mobilization STM and IASTM to Lt pec, upper trap, biceps brachii, pronator teres, tricep brachii to decrease fascial restrictions and improve ROM.    Passive ROM Lt shoulder ER in scapular plane  PT Short Term Goals - 10/12/21 1526       PT SHORT TERM GOAL #1   Title Complete shoulder evaluation assessing ROM Lt shoulder as indicated    Time 2    Period Weeks    Status Achieved    Target Date 10/17/21      PT SHORT TERM GOAL #2   Title Instruct patient in initial HEP for Lt shoulder dysfunction following AC dislocation and Lt radius fx    Time 4    Period Weeks    Status Achieved    Target Date 11/01/21               PT Long Term Goals - 10/18/21 0931       PT LONG TERM GOAL #1   Title Improve posture and alignment with patient to demonstrate improved upright posture with posterior shoulder girdle musculature engaged    Time 8    Period Weeks    Status On-going    Target Date 11/28/21      PT LONG TERM GOAL #2   Title Increase AROM Lt shoulder per MD protocol to allow functional use Lt UE    Time 8    Period Weeks    Status On-going    Target Date 11/28/21      PT LONG TERM GOAL #3   Title Increase strength Lt shoulder to 4/5 to 5/5 to  prepare pt to return to work    Time 8    Period Weeks    Status On-going    Target Date 11/28/21      PT LONG TERM GOAL #4   Title Independent in HEP (including aquatic exercise as indicated)    Time 8    Period Weeks    Status On-going    Target Date 11/28/21      PT LONG TERM GOAL #5   Title Improve functional limitation score to 64    Time 8    Period Weeks    Status On-going    Target Date 11/28/21                   Plan - 10/18/21 0839     Clinical Impression Statement Pt's AAROM to L shoulder progressing well.  Positive response to IASTM to ant Lt shoulder and arm, with report of less tightness. Progressing well towards goals of therapy.    Rehab Potential Good    PT Frequency 2x / week    PT Duration 8 weeks    PT Treatment/Interventions ADLs/Self Care Home Management;Aquatic Therapy;Cryotherapy;Electrical Stimulation;Iontophoresis 4mg /ml Dexamethasone;Moist Heat;Ultrasound;Functional mobility training;Therapeutic activities;Therapeutic exercise;Neuromuscular re-education;Patient/family education;Manual techniques;Scar mobilization;Passive range of motion;Dry needling;Vasopneumatic Device    PT Next Visit Plan continue progressive exercises per rehab protocol.    PT Home Exercise Plan [id number]    Consulted and Agree with Plan of Care Patient             Patient will benefit from skilled therapeutic intervention in order to improve the following deficits and impairments:     Visit Diagnosis: Acute pain of left shoulder  Other symptoms and signs involving the musculoskeletal system  Abnormal posture  Muscle weakness (generalized)     Problem List Patient Active Problem List   Diagnosis Date Noted   Left wrist fracture 08/11/2021   10/11/2021, PTA 10/18/21 11:28 AM  [facility] Outpatient Rehabilitation [place] [address] [place] [address] [place], [place], [place] Phone: [phone]   Fax:  [phone]  Name:  Casey Wilson MRN: [id number] Date of Birth: 03/14/1976

## 2021-10-21 ENCOUNTER — Other Ambulatory Visit: Payer: Self-pay

## 2021-10-21 ENCOUNTER — Ambulatory Visit (INDEPENDENT_AMBULATORY_CARE_PROVIDER_SITE_OTHER): Payer: Commercial Managed Care - PPO | Admitting: Rehabilitative and Restorative Service Providers"

## 2021-10-21 ENCOUNTER — Encounter: Payer: Self-pay | Admitting: Rehabilitative and Restorative Service Providers"

## 2021-10-21 DIAGNOSIS — R29898 Other symptoms and signs involving the musculoskeletal system: Secondary | ICD-10-CM

## 2021-10-21 DIAGNOSIS — M25512 Pain in left shoulder: Secondary | ICD-10-CM

## 2021-10-21 DIAGNOSIS — M6281 Muscle weakness (generalized): Secondary | ICD-10-CM

## 2021-10-21 DIAGNOSIS — R293 Abnormal posture: Secondary | ICD-10-CM

## 2021-10-21 NOTE — Therapy (Signed)
[facility] Outpatient Rehabilitation [place] [address] [place] [address] [place], [place], [address] Phone: [phone]   Fax:  [phone]  Physical Therapy Treatment  Patient Details  Name: Casey Wilson MRN: [id number] Date of Birth: Dec 29, 1975 Referring Provider (PT): Dr Ramond Marrow   Encounter Date: 10/21/2021   PT End of Session - 10/21/21 0846     Visit Number 6    Number of Visits 16    Date for PT Re-Evaluation 11/28/21    PT Start Time 0840    PT Stop Time 0926    PT Time Calculation (min) 46 min    Activity Tolerance Patient tolerated treatment well;No increased pain             History reviewed. No pertinent past medical history.  Past Surgical History:  Procedure Laterality Date   ACHILLES TENDON REPAIR     Both feet   ORIF WRIST FRACTURE Left 08/11/2021   Procedure: OPEN REDUCTION INTERNAL FIXATION (ORIF) DISTAL RADIUS POSSIBLE PINNING DRUJ, POSSIBLE TRIANGULAR FIBER CARTILAGE COMPLEX REPAIR;  Surgeon: Betha Loa, MD;  Location: [facility] OR;  Service: Orthopedics;  Laterality: Left;   orthognatic     jaw repair for overbite    There were no vitals filed for this visit.   Subjective Assessment - 10/21/21 0847     Subjective Having some swelling and tightnes in the Lt wrist today - they worked hard to get the wrist moving yesterday. Shoulder is doing OK.    Currently in Pain? No/denies    Pain Score 0-No pain    Pain Location Shoulder    Pain Orientation Left                               [facility] Adult PT Treatment/Exercise - 10/21/21 0001       Shoulder Exercises: Standing   Internal Rotation AAROM;10 reps   with cane,  2 sets - 2 variations   Extension AAROM;Both;10 reps   with cane   Other Standing Exercises wall ladder x 10 reps in flexion      Shoulder Exercises: Pulleys   Flexion Limitations 10 reps x 10 sec hold    Scaption Limitations 10 reps x 10 sec hold      Shoulder Exercises: Isometric Strengthening    Flexion 5X5"    Extension 5X5"    External Rotation 5X5"    Internal Rotation 5X5"    ABduction 5X5"      Shoulder Exercises: Stretch   External Rotation Stretch 3 reps;20 seconds   ER arm resting on the doorway gently turning to the Rt     Cryotherapy   Number Minutes Cryotherapy 10 Minutes    Cryotherapy Location Wrist;Hand    Type of Cryotherapy Ice pack      Vasopneumatic   Number Minutes Vasopneumatic  10 minutes    Vasopnuematic Location  Shoulder   L   Vasopneumatic Pressure Low    Vasopneumatic Temperature  34      Manual Therapy   Soft tissue mobilization STM and IASTM to Lt SCM; lateral cervical musculature; pec, upper trap, biceps brachii, pronator teres to decrease fascial restrictions and improve ROM.    Passive ROM Lt shoulder flexion; scaption; ER in scapular plane                       PT Short Term Goals - 10/12/21 1526       PT  SHORT TERM GOAL #1   Title Complete shoulder evaluation assessing ROM Lt shoulder as indicated    Time 2    Period Weeks    Status Achieved    Target Date 10/17/21      PT SHORT TERM GOAL #2   Title Instruct patient in initial HEP for Lt shoulder dysfunction following AC dislocation and Lt radius fx    Time 4    Period Weeks    Status Achieved    Target Date 11/01/21               PT Long Term Goals - 10/18/21 0931       PT LONG TERM GOAL #1   Title Improve posture and alignment with patient to demonstrate improved upright posture with posterior shoulder girdle musculature engaged    Time 8    Period Weeks    Status On-going    Target Date 11/28/21      PT LONG TERM GOAL #2   Title Increase AROM Lt shoulder per MD protocol to allow functional use Lt UE    Time 8    Period Weeks    Status On-going    Target Date 11/28/21      PT LONG TERM GOAL #3   Title Increase strength Lt shoulder to 4/5 to 5/5 to prepare pt to return to work    Time 8    Period Weeks    Status On-going    Target Date  11/28/21      PT LONG TERM GOAL #4   Title Independent in HEP (including aquatic exercise as indicated)    Time 8    Period Weeks    Status On-going    Target Date 11/28/21      PT LONG TERM GOAL #5   Title Improve functional limitation score to 64    Time 8    Period Weeks    Status On-going    Target Date 11/28/21                   Plan - 10/21/21 0847     Clinical Impression Statement Continued progress with Lt shoulder ROM. Noted tightness through the clavicular area at [facility] and cervical musculature. Progressing well with shoulder rehab per protocol.    Rehab Potential Good    PT Frequency 2x / week    PT Duration 8 weeks    PT Treatment/Interventions ADLs/Self Care Home Management;Aquatic Therapy;Cryotherapy;Electrical Stimulation;Iontophoresis 4mg /ml Dexamethasone;Moist Heat;Ultrasound;Functional mobility training;Therapeutic activities;Therapeutic exercise;Neuromuscular re-education;Patient/family education;Manual techniques;Scar mobilization;Passive range of motion;Dry needling;Vasopneumatic Device    PT Next Visit Plan continue progressive exercises per rehab protocol.    PT Home Exercise Plan [id number]    Consulted and Agree with Plan of Care Patient             Patient will benefit from skilled therapeutic intervention in order to improve the following deficits and impairments:     Visit Diagnosis: Acute pain of left shoulder  Other symptoms and signs involving the musculoskeletal system  Abnormal posture  Muscle weakness (generalized)     Problem List Patient Active Problem List   Diagnosis Date Noted   Left wrist fracture 08/11/2021    Jaunita Mikels 10/11/2021, PT, MPH  10/21/2021, 9:30 AM  [facility] [address] [place] [address] [place], [place], [place] Phone: [phone]   Fax:  [phone]  Name: Casey Wilson MRN: Blima Rich Date of Birth: [birth year]

## 2021-10-26 ENCOUNTER — Ambulatory Visit (INDEPENDENT_AMBULATORY_CARE_PROVIDER_SITE_OTHER): Payer: Self-pay | Admitting: Physical Therapy

## 2021-10-26 ENCOUNTER — Encounter: Payer: Self-pay | Admitting: Physical Therapy

## 2021-10-26 ENCOUNTER — Other Ambulatory Visit: Payer: Self-pay

## 2021-10-26 DIAGNOSIS — R29898 Other symptoms and signs involving the musculoskeletal system: Secondary | ICD-10-CM

## 2021-10-26 DIAGNOSIS — M6281 Muscle weakness (generalized): Secondary | ICD-10-CM

## 2021-10-26 DIAGNOSIS — M25512 Pain in left shoulder: Secondary | ICD-10-CM

## 2021-10-26 DIAGNOSIS — R293 Abnormal posture: Secondary | ICD-10-CM
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
## 2021-10-26 NOTE — Therapy (Signed)
[facility] [place] [address] [place], [place], [address] Phone: [phone]   Fax:  [phone]  Physical Therapy Treatment  Patient Details  Name: Casey Wilson MRN: [id number] Date of Birth: 1976-07-08 Referring Provider (PT): Dr Ramond Marrow   Encounter Date: 10/26/2021   PT End of Session - 10/26/21 1601     Visit Number 7    Number of Visits 16    Date for PT Re-Evaluation 11/28/21    PT Start Time 1554    PT Stop Time 1645    PT Time Calculation (min) 51 min    Activity Tolerance Patient tolerated treatment well;No increased pain    Behavior During Therapy [facility] for tasks assessed/performed             History reviewed. No pertinent past medical history.  Past Surgical History:  Procedure Laterality Date   ACHILLES TENDON REPAIR     Both feet   ORIF WRIST FRACTURE Left 08/11/2021   Procedure: OPEN REDUCTION INTERNAL FIXATION (ORIF) DISTAL RADIUS POSSIBLE PINNING DRUJ, POSSIBLE TRIANGULAR FIBER CARTILAGE COMPLEX REPAIR;  Surgeon: Betha Loa, MD;  Location: [facility] OR;  Service: Orthopedics;  Laterality: Left;   orthognatic     jaw repair for overbite    There were no vitals filed for this visit.   Subjective Assessment - 10/26/21 1558     Subjective Pt reports he is starting to get more range in his Lt wrist.  He complains of stiffness in Lt ant shoulder.  HEP is going ok at home.    Patient Stated Goals get shoulder working again so he can return to normal function    Currently in Pain? No/denies    Pain Score 0-No pain                [facility] PT Assessment - 10/26/21 0001       Assessment   Medical Diagnosis Lt shoulder AC repair    Referring Provider (PT) Dr Ramond Marrow    Onset Date/Surgical Date 08/18/21   injury 08/11/21   Hand Dominance Right    Next MD Visit 11/18/20    Prior Therapy .              [facility] Adult PT Treatment/Exercise - 10/26/21 0001       Shoulder Exercises: Supine    Horizontal ABduction AAROM;Left;5 reps   and horiz add with cane - 10 sec hold each direction   External Rotation Left;10 reps;AAROM   supported scaption, cane     Shoulder Exercises: Standing   Internal Rotation AAROM;10 reps   with cane,  2 sets - 2 variations   Extension AAROM;Both;10 reps   with cane     Shoulder Exercises: Pulleys   Flexion Limitations 10 reps x 5 sec hold    Scaption Limitations 10 reps x 5 sec hold      Shoulder Exercises: Stretch   External Rotation Stretch 20 seconds;2 reps   ER arm resting on the doorway gently turning to the Rt   Star Gazer Stretch 20 seconds   back of palm to forehead   Other Shoulder Stretches Lt shoulder ext stretch holdiing counter x 3 reps of 20 sec      Vasopneumatic   Number Minutes Vasopneumatic  10 minutes    Vasopnuematic Location  Shoulder   L   Vasopneumatic Pressure Medium    Vasopneumatic Temperature  34      Manual Therapy   Soft tissue mobilization STM  and IASTM to Lt upper trap, cervical paraspinals, scalenes, biceps brachii, rhmboid, pec major to decrease fascial restrictions and improve mobility.    Passive ROM Lt shoulder scaption and ER in scaption.                       PT Short Term Goals - 10/12/21 1526       PT SHORT TERM GOAL #1   Title Complete shoulder evaluation assessing ROM Lt shoulder as indicated    Time 2    Period Weeks    Status Achieved    Target Date 10/17/21      PT SHORT TERM GOAL #2   Title Instruct patient in initial HEP for Lt shoulder dysfunction following AC dislocation and Lt radius fx    Time 4    Period Weeks    Status Achieved    Target Date 11/01/21               PT Long Term Goals - 10/18/21 0931       PT LONG TERM GOAL #1   Title Improve posture and alignment with patient to demonstrate improved upright posture with posterior shoulder girdle musculature engaged    Time 8    Period Weeks    Status On-going    Target Date 11/28/21      PT LONG TERM  GOAL #2   Title Increase AROM Lt shoulder per MD protocol to allow functional use Lt UE    Time 8    Period Weeks    Status On-going    Target Date 11/28/21      PT LONG TERM GOAL #3   Title Increase strength Lt shoulder to 4/5 to 5/5 to prepare pt to return to work    Time 8    Period Weeks    Status On-going    Target Date 11/28/21      PT LONG TERM GOAL #4   Title Independent in HEP (including aquatic exercise as indicated)    Time 8    Period Weeks    Status On-going    Target Date 11/28/21      PT LONG TERM GOAL #5   Title Improve functional limitation score to 64    Time 8    Period Weeks    Status On-going    Target Date 11/28/21                   Plan - 10/26/21 1659     Clinical Impression Statement Pt required minor cues to bring head to neutral (from Rt lateral flexion) with exercises, and to relax upper trap down with pulleys.  Otherwise, ROM is progressing gradually and exercises were tolerated well.  Progressing towards LTGs.    Rehab Potential Good    PT Frequency 2x / week    PT Duration 8 weeks    PT Treatment/Interventions ADLs/Self Care Home Management;Aquatic Therapy;Cryotherapy;Electrical Stimulation;Iontophoresis 4mg /ml Dexamethasone;Moist Heat;Ultrasound;Functional mobility training;Therapeutic activities;Therapeutic exercise;Neuromuscular re-education;Patient/family education;Manual techniques;Scar mobilization;Passive range of motion;Dry needling;Vasopneumatic Device    PT Next Visit Plan continue progressive exercises per rehab protocol. moves to next phase 11/10/20 (12 wks post op)    PT Home Exercise Plan [email or web address]    Consulted and Agree with Plan of Care Patient             Patient will benefit from skilled therapeutic intervention in order to improve the following deficits and impairments:  Decreased range of motion, Increased fascial restricitons,  Impaired UE functional use, Decreased activity tolerance, Pain, Impaired flexibility,  Improper body mechanics, Decreased mobility, Decreased strength, Increased edema, Postural dysfunction  Visit Diagnosis: Acute pain of left shoulder  Other symptoms and signs involving the musculoskeletal system  Abnormal posture  Muscle weakness (generalized)     Problem List Patient Active Problem List   Diagnosis Date Noted   Left wrist fracture 08/11/2021    Mayer Camel, PTA 10/26/21 5:01 PM   [facility] [place] [address] [place], [place], [address] Phone: [phone]   Fax:  [phone]  Name: Casey Wilson MRN: [id number] Date of Birth: [birth year]

## 2021-10-28 ENCOUNTER — Encounter: Payer: Self-pay | Admitting: Rehabilitative and Restorative Service Providers"

## 2021-10-28 ENCOUNTER — Ambulatory Visit (INDEPENDENT_AMBULATORY_CARE_PROVIDER_SITE_OTHER): Payer: Commercial Managed Care - PPO | Admitting: Rehabilitative and Restorative Service Providers"

## 2021-10-28 ENCOUNTER — Other Ambulatory Visit: Payer: Self-pay

## 2021-10-28 DIAGNOSIS — R29898 Other symptoms and signs involving the musculoskeletal system: Secondary | ICD-10-CM | POA: Diagnosis not present

## 2021-10-28 DIAGNOSIS — R293 Abnormal posture: Secondary | ICD-10-CM

## 2021-10-28 DIAGNOSIS — M6281 Muscle weakness (generalized): Secondary | ICD-10-CM

## 2021-10-28 DIAGNOSIS — M25512 Pain in left shoulder: Secondary | ICD-10-CM | POA: Diagnosis not present

## 2021-10-28 NOTE — Therapy (Signed)
[facility] [place] [address] [place] [address] [place], [place], [address] Phone: [phone]   Fax:  [phone]  Physical Therapy Treatment  Patient Details  Name: Casey Wilson MRN: [id number] Date of Birth: 06-13-1976 Referring Provider (PT): Dr Ramond Marrow   Encounter Date: 10/28/2021   PT End of Session - 10/28/21 0804     Visit Number 8    Number of Visits 16    Date for PT Re-Evaluation 11/28/21    PT Start Time 0758    PT Stop Time 0850    PT Time Calculation (min) 52 min    Activity Tolerance Patient tolerated treatment well;No increased pain             History reviewed. No pertinent past medical history.  Past Surgical History:  Procedure Laterality Date   ACHILLES TENDON REPAIR     Both feet   ORIF WRIST FRACTURE Left 08/11/2021   Procedure: OPEN REDUCTION INTERNAL FIXATION (ORIF) DISTAL RADIUS POSSIBLE PINNING DRUJ, POSSIBLE TRIANGULAR FIBER CARTILAGE COMPLEX REPAIR;  Surgeon: Betha Loa, MD;  Location: [facility] OR;  Service: Orthopedics;  Laterality: Left;   orthognatic     jaw repair for overbite    There were no vitals filed for this visit.   Subjective Assessment - 10/28/21 0805     Subjective Some increased soreness and stiffness today probably due to the cold weather.    Currently in Pain? Yes    Pain Score 2     Pain Location Shoulder    Pain Orientation Left                               [facility] Adult PT Treatment/Exercise - 10/28/21 0001       Shoulder Exercises: Standing   External Rotation Limitations ball on dorsum of hand small circles UE in slight abduction and ER pt standing at angle to wall circles CW/CCW ~ 10 each direction    Internal Rotation AAROM;10 reps   with cane,  2 sets - 2 variations   Extension AAROM;Both;10 reps   with cane   Other Standing Exercises scap squeeze with noodle 10 sec x 5 reps    Other Standing Exercises wall ladder x 10 reps in flexion       Shoulder Exercises: Pulleys   Flexion Limitations 10 reps x 5 sec hold    Scaption Limitations 10 reps x 5 sec hold      Shoulder Exercises: Therapy Ball   Other Therapy Ball Exercises working with ball in both hands for closed chain active movement - biceps curl; shoulder push/pull; wrist flexion/extension in extended position; wrist ulnar and radial deviation in shd extension; steering wheel; wood chop both directions; pushing ball overhead; overhead ball behind head moving into extension x 10-15 reps    Other Therapy Ball Exercises ball at side rolling frw/back; in out; circles CW/CCW; pressing into ball; tapping each finger      Shoulder Exercises: Isometric Strengthening   Flexion 5X5"    Extension 5X5"    External Rotation 5X5"    Internal Rotation 5X5"    ABduction 5X5"      Shoulder Exercises: Stretch   External Rotation Stretch 20 seconds;2 reps   ER arm resting on the doorway gently turning to the Rt   Other Shoulder Stretches horizontal abduction 3 reps 10 sec hold pt pulling Lt UE across chest    Other Shoulder Stretches Lt shoulder ext  stretch holdiing counter x 3 reps of 20 sec      Cryotherapy   Number Minutes Cryotherapy 10 Minutes    Cryotherapy Location Wrist;Hand    Type of Cryotherapy Ice pack      Vasopneumatic   Number Minutes Vasopneumatic  10 minutes    Vasopnuematic Location  Shoulder   L   Vasopneumatic Pressure Medium    Vasopneumatic Temperature  34      Manual Therapy   Soft tissue mobilization STM to Lt upper trap, cervical paraspinals, scalenes, biceps brachii, rhmboid, pec major to decrease fascial restrictions and improve mobility.    Passive ROM Lt shoulder scaption and ER in scaption; extension; horizontal abduction                     PT Education - 10/28/21 0910     Education Details HEP    Person(s) Educated Patient    Methods Explanation;Demonstration;Tactile cues;Verbal cues;Handout    Comprehension Verbalized  understanding;Returned demonstration;Verbal cues required;Tactile cues required              PT Short Term Goals - 10/12/21 1526       PT SHORT TERM GOAL #1   Title Complete shoulder evaluation assessing ROM Lt shoulder as indicated    Time 2    Period Weeks    Status Achieved    Target Date 10/17/21      PT SHORT TERM GOAL #2   Title Instruct patient in initial HEP for Lt shoulder dysfunction following AC dislocation and Lt radius fx    Time 4    Period Weeks    Status Achieved    Target Date 11/01/21               PT Long Term Goals - 10/18/21 0931       PT LONG TERM GOAL #1   Title Improve posture and alignment with patient to demonstrate improved upright posture with posterior shoulder girdle musculature engaged    Time 8    Period Weeks    Status On-going    Target Date 11/28/21      PT LONG TERM GOAL #2   Title Increase AROM Lt shoulder per MD protocol to allow functional use Lt UE    Time 8    Period Weeks    Status On-going    Target Date 11/28/21      PT LONG TERM GOAL #3   Title Increase strength Lt shoulder to 4/5 to 5/5 to prepare pt to return to work    Time 8    Period Weeks    Status On-going    Target Date 11/28/21      PT LONG TERM GOAL #4   Title Independent in HEP (including aquatic exercise as indicated)    Time 8    Period Weeks    Status On-going    Target Date 11/28/21      PT LONG TERM GOAL #5   Title Improve functional limitation score to 64    Time 8    Period Weeks    Status On-going    Target Date 11/28/21                   Plan - 10/28/21 0908     Clinical Impression Statement Progressing with closed chain exercises using ball for Lt UE. Cross body adduction was min tight. He does continue to be tight in all Lt shoulder motions. Added passive stretch  into shoulder extension and horizontal abduction. Progressing well toward stated goals of rehab.    Rehab Potential Good    PT Frequency 2x / week    PT  Duration 8 weeks    PT Treatment/Interventions ADLs/Self Care Home Management;Aquatic Therapy;Cryotherapy;Electrical Stimulation;Iontophoresis 4mg /ml Dexamethasone;Moist Heat;Ultrasound;Functional mobility training;Therapeutic activities;Therapeutic exercise;Neuromuscular re-education;Patient/family education;Manual techniques;Scar mobilization;Passive range of motion;Dry needling;Vasopneumatic Device    PT Next Visit Plan continue progressive exercises per rehab protocol. moves to next phase 11/10/20 (12 wks post op)    PT Home Exercise Plan [email or web address]    Consulted and Agree with Plan of Care Patient             Patient will benefit from skilled therapeutic intervention in order to improve the following deficits and impairments:     Visit Diagnosis: Acute pain of left shoulder  Other symptoms and signs involving the musculoskeletal system  Abnormal posture  Muscle weakness (generalized)     Problem List Patient Active Problem List   Diagnosis Date Noted   Left wrist fracture 08/11/2021    Konstantina Nachreiner 10/11/2021, PT, MPH  10/28/2021, 9:21 AM  [facility] [address] [place], [place], [place] Phone: [phone]   Fax:  [phone]  Name: Evertte Sones MRN: Blima Rich Date of Birth: [birth year]

## 2021-10-28 NOTE — Patient Instructions (Addendum)
Ball between hands  Chest to thigh Chest out Out for wrist radial ulnar deviation Out for wrist flexion extension Out for steering wheel Wood chop both sides Overhead press  Hold above head and bring ball behind head   Ball at left side   Roll forward and back Roll side to side Circles clockwise and counterclockwise   Resting hand on ball   Lift each finger tapping ball  Press into the ball and hold 5 sec   Access Code: [email or web address] URL: [email or web address] Date: 10/28/2021 Prepared by: Corlis Leak  Exercises Seated Cervical Rotation AROM - 2 x daily - 7 x weekly - 1 sets - 5 reps - 2-3 sec hold Seated Cervical Sidebending AROM - 2 x daily - 7 x weekly - 1 sets - 5 reps - 5-10 sec hold Seated Scapular Retraction - 2 x daily - 7 x weekly - 1-2 sets - 10 reps - 10 sec hold Circular Shoulder Pendulum with Table Support - 3-4 x daily - 7 x weekly - 1 sets - 20-30 reps Isometric Shoulder Extension at Wall - 2 x daily - 7 x weekly - 1 sets - 5-10 reps - 3-5 sec hold Isometric Shoulder Abduction at Wall - 2 x daily - 7 x weekly - 1 sets - 5-10 reps - 3-5 sec hold Isometric Shoulder External Rotation at Wall - 2 x daily - 7 x weekly - 1 sets - 5 reps - 3-5 sec hold Isometric Shoulder Flexion at Wall - 2 x daily - 7 x weekly - 1 sets - 5-10 reps - 3-5 sec hold Isometric Shoulder Internal Rotation - 2 x daily - 7 x weekly - 1 sets - 5-10 reps - 3-5 sec hold Isometric Shoulder External Rotation in Abduction with Ball at Wall - 1 x daily - 7 x weekly - 1-2 min hold

## 2021-11-04 ENCOUNTER — Other Ambulatory Visit: Payer: Self-pay

## 2021-11-04 ENCOUNTER — Ambulatory Visit (INDEPENDENT_AMBULATORY_CARE_PROVIDER_SITE_OTHER): Payer: Commercial Managed Care - PPO | Admitting: Physical Therapy

## 2021-11-04 DIAGNOSIS — R29898 Other symptoms and signs involving the musculoskeletal system: Secondary | ICD-10-CM

## 2021-11-04 DIAGNOSIS — M25512 Pain in left shoulder: Secondary | ICD-10-CM

## 2021-11-04 DIAGNOSIS — M6281 Muscle weakness (generalized): Secondary | ICD-10-CM | POA: Diagnosis not present

## 2021-11-04 DIAGNOSIS — R293 Abnormal posture: Secondary | ICD-10-CM

## 2021-11-04 NOTE — Therapy (Signed)
[facility] Rehabilitation [place] [address] [place], [place], [address] Phone: [phone]   Fax:  [phone]  Physical Therapy Treatment  Patient Details  Name: Casey Wilson MRN: [id number] Date of Birth: 01-28-1976 Referring Provider (PT): Dr Ramond Marrow   Encounter Date: 11/04/2021   PT End of Session - 11/04/21 0831     Visit Number 9    Number of Visits 16    Date for PT Re-Evaluation 11/28/21    PT Start Time 0805    PT Stop Time 0851    PT Time Calculation (min) 46 min    Activity Tolerance Patient tolerated treatment well    Behavior During Therapy [facility] for tasks assessed/performed             No past medical history on file.  Past Surgical History:  Procedure Laterality Date   ACHILLES TENDON REPAIR     Both feet   ORIF WRIST FRACTURE Left 08/11/2021   Procedure: OPEN REDUCTION INTERNAL FIXATION (ORIF) DISTAL RADIUS POSSIBLE PINNING DRUJ, POSSIBLE TRIANGULAR FIBER CARTILAGE COMPLEX REPAIR;  Surgeon: Betha Loa, MD;  Location: [facility] OR;  Service: Orthopedics;  Laterality: Left;   orthognatic     jaw repair for overbite    There were no vitals filed for this visit.   Subjective Assessment - 11/04/21 0829     Subjective Pt reports no new changes with Lt shoulder since last visit.  Continues with OT 2x/wk.    Patient Stated Goals get shoulder working again so he can return to normal function    Currently in Pain? No/denies    Pain Score 0-No pain                [facility] PT Assessment - 11/04/21 0001       Assessment   Medical Diagnosis Lt shoulder AC repair    Referring Provider (PT) Dr Ramond Marrow    Onset Date/Surgical Date 08/18/21   injury 08/11/21   Hand Dominance Right    Next MD Visit 11/18/20    Prior Therapy .      AROM   Left Shoulder Extension 41 Degrees    Left Shoulder Flexion 130 Degrees    Left Shoulder Internal Rotation --   ~thumb to L4   Left Shoulder External Rotation 49 Degrees   arm  at side, elbow flexed.             [facility] Adult PT Treatment/Exercise - 11/04/21 0001       Elbow Exercises   Elbow Flexion Strengthening;Left;10 reps   2#     Shoulder Exercises: Pulleys   Flexion Limitations 10 reps x 5 sec hold    Scaption Limitations 10 reps x 5 sec hold      Shoulder Exercises: ROM/Strengthening   Wall Pushups 10 reps   partial range due to limited Lt wrist flexion     Shoulder Exercises: Isometric Strengthening   Flexion Limitations reactive isometric with arm in neutral, 5 sec hold x 10 RED    Extension Limitations reactive isometric with arm in neutral, 5 sec hold x 10 RED    External Rotation Limitations reactive isometric with arm in neutral, 5 sec hold x 10 RED    Internal Rotation Limitations reactive isometric with arm in neutral, 5 sec hold x 10 RED      Shoulder Exercises: Stretch   Internal Rotation Stretch 2 reps   20 sec   External Rotation Stretch 20 seconds;3 reps   ER  arm resting on the doorway gently turning to the Rt   Other Shoulder Stretches Lt shoulder ext stretch holding counter x 3 reps of 20 sec      Vasopneumatic   Number Minutes Vasopneumatic  10 minutes    Vasopnuematic Location  Shoulder   L   Vasopneumatic Pressure Medium    Vasopneumatic Temperature  34                     PT Education - 11/04/21 0847     Education Details added reacitive isometrics; issue red band with handle    Person(s) Educated Patient    Methods Explanation;Demonstration;Verbal cues    Comprehension Verbalized understanding;Returned demonstration              PT Short Term Goals - 10/12/21 1526       PT SHORT TERM GOAL #1   Title Complete shoulder evaluation assessing ROM Lt shoulder as indicated    Time 2    Period Weeks    Status Achieved    Target Date 10/17/21      PT SHORT TERM GOAL #2   Title Instruct patient in initial HEP for Lt shoulder dysfunction following AC dislocation and Lt radius fx    Time 4    Period  Weeks    Status Achieved    Target Date 11/01/21               PT Long Term Goals - 10/18/21 0931       PT LONG TERM GOAL #1   Title Improve posture and alignment with patient to demonstrate improved upright posture with posterior shoulder girdle musculature engaged    Time 8    Period Weeks    Status On-going    Target Date 11/28/21      PT LONG TERM GOAL #2   Title Increase AROM Lt shoulder per MD protocol to allow functional use Lt UE    Time 8    Period Weeks    Status On-going    Target Date 11/28/21      PT LONG TERM GOAL #3   Title Increase strength Lt shoulder to 4/5 to 5/5 to prepare pt to return to work    Time 8    Period Weeks    Status On-going    Target Date 11/28/21      PT LONG TERM GOAL #4   Title Independent in HEP (including aquatic exercise as indicated)    Time 8    Period Weeks    Status On-going    Target Date 11/28/21      PT LONG TERM GOAL #5   Title Improve functional limitation score to 64    Time 8    Period Weeks    Status On-going    Target Date 11/28/21                   Plan - 11/04/21 0843     Clinical Impression Statement Trialed reactive isometrics for Lt shoulder without difficulty.  Added to HEP and pt issued red band with handle.  Encouraged pt to continue working on ER AAROM and hold flexion stretches a little longer to increase end range.  Limited wrist ext is barrier to WB exercises; tolerated small range wall push up without increased pain.  Pt moves to next phase of rehab on 11/10/21.    Rehab Potential Good    PT Frequency 2x / week  PT Duration 8 weeks    PT Treatment/Interventions ADLs/Self Care Home Management;Aquatic Therapy;Cryotherapy;Electrical Stimulation;Iontophoresis 4mg /ml Dexamethasone;Moist Heat;Ultrasound;Functional mobility training;Therapeutic activities;Therapeutic exercise;Neuromuscular re-education;Patient/family education;Manual techniques;Scar mobilization;Passive range of motion;Dry  needling;Vasopneumatic Device    PT Next Visit Plan continue progressive exercises per rehab protocol.  (11/10/20 =12 wks post op)    PT Home Exercise Plan [email or web address]    Consulted and Agree with Plan of Care Patient             Patient will benefit from skilled therapeutic intervention in order to improve the following deficits and impairments:  Decreased range of motion, Increased fascial restricitons, Impaired UE functional use, Decreased activity tolerance, Pain, Impaired flexibility, Improper body mechanics, Decreased mobility, Decreased strength, Increased edema, Postural dysfunction  Visit Diagnosis: Acute pain of left shoulder  Other symptoms and signs involving the musculoskeletal system  Abnormal posture  Muscle weakness (generalized)     Problem List Patient Active Problem List   Diagnosis Date Noted   Left wrist fracture 08/11/2021   10/11/2021, PTA 11/04/21 8:48 AM   [facility] [place] [address] [place], [place], [place] Phone: [phone]   Fax:  [phone]  Name: Casey Wilson MRN: Blima Rich Date of Birth: May 10, 1976

## 2021-11-09 ENCOUNTER — Ambulatory Visit: Payer: Commercial Managed Care - PPO | Attending: Orthopaedic Surgery | Admitting: Physical Therapy

## 2021-11-09 ENCOUNTER — Other Ambulatory Visit: Payer: Self-pay

## 2021-11-09 DIAGNOSIS — M6281 Muscle weakness (generalized): Secondary | ICD-10-CM | POA: Diagnosis not present

## 2021-11-09 DIAGNOSIS — R293 Abnormal posture: Secondary | ICD-10-CM

## 2021-11-09 DIAGNOSIS — Z9889 Other specified postprocedural states: Secondary | ICD-10-CM | POA: Diagnosis present

## 2021-11-09 DIAGNOSIS — R29898 Other symptoms and signs involving the musculoskeletal system: Secondary | ICD-10-CM

## 2021-11-09 DIAGNOSIS — M25512 Pain in left shoulder: Secondary | ICD-10-CM | POA: Diagnosis present

## 2021-11-09 NOTE — Therapy (Signed)
[facility] Outpatient Rehabilitation [place] [address] [place] [address] [place], [place], [address] Phone: [phone]   Fax:  [phone]  Physical Therapy Treatment  Patient Details  Name: Casey Wilson MRN: [id number] Date of Birth: 22-Feb-1976 Referring Provider (PT): Dr Ramond Marrow   Encounter Date: 11/09/2021   PT End of Session - 11/09/21 0916     Visit Number 10    Number of Visits 16    Date for PT Re-Evaluation 11/28/21    PT Start Time 0845    PT Stop Time 0930    PT Time Calculation (min) 45 min    Activity Tolerance Patient tolerated treatment well    Behavior During Therapy [facility] for tasks assessed/performed             No past medical history on file.  Past Surgical History:  Procedure Laterality Date   ACHILLES TENDON REPAIR     Both feet   ORIF WRIST FRACTURE Left 08/11/2021   Procedure: OPEN REDUCTION INTERNAL FIXATION (ORIF) DISTAL RADIUS POSSIBLE PINNING DRUJ, POSSIBLE TRIANGULAR FIBER CARTILAGE COMPLEX REPAIR;  Surgeon: Betha Loa, MD;  Location: [facility] OR;  Service: Orthopedics;  Laterality: Left;   orthognatic     jaw repair for overbite    There were no vitals filed for this visit.   Subjective Assessment - 11/09/21 0840     Subjective Pt states he feels "a little stiff"    Patient Stated Goals get shoulder working again so he can return to normal function    Currently in Pain? No/denies                               [facility] Adult PT Treatment/Exercise - 11/09/21 0001       Shoulder Exercises: Standing   Other Standing Exercises 90/90 carry 4# x 2 laps, wall wash 2 x 1 min    Other Standing Exercises suitcase carry 5# KB x 2 laps, ball on wall x 60 seconds with circles      Shoulder Exercises: Pulleys   Flexion Limitations 10 reps x 5 sec hold    Scaption Limitations 10 reps x 5 sec hold      Shoulder Exercises: Isometric Strengthening   Flexion Limitations reactive isometric with arm in neutral, 5 sec  hold x 10 RED    Extension Limitations reactive isometric with arm in neutral, 5 sec hold x 10 RED    External Rotation Limitations reactive isometric with arm in neutral, 5 sec hold x 10 RED    Internal Rotation Limitations reactive isometric with arm in neutral, 5 sec hold x 10 RED      Shoulder Exercises: Stretch   Internal Rotation Stretch 2 reps    Internal Rotation Stretch Limitations towel behind back with cues    External Rotation Stretch 3 reps;20 seconds   ER in doorway   Other Shoulder Stretches Lt shoulder ext stretch holding counter x 3 reps of 20 sec      Vasopneumatic   Number Minutes Vasopneumatic  10 minutes    Vasopnuematic Location  Shoulder    Vasopneumatic Pressure Medium    Vasopneumatic Temperature  34                       PT Short Term Goals - 10/12/21 1526       PT SHORT TERM GOAL #1   Title Complete shoulder evaluation assessing ROM Lt shoulder as  indicated    Time 2    Period Weeks    Status Achieved    Target Date 10/17/21      PT SHORT TERM GOAL #2   Title Instruct patient in initial HEP for Lt shoulder dysfunction following AC dislocation and Lt radius fx    Time 4    Period Weeks    Status Achieved    Target Date 11/01/21               PT Long Term Goals - 10/18/21 0931       PT LONG TERM GOAL #1   Title Improve posture and alignment with patient to demonstrate improved upright posture with posterior shoulder girdle musculature engaged    Time 8    Period Weeks    Status On-going    Target Date 11/28/21      PT LONG TERM GOAL #2   Title Increase AROM Lt shoulder per MD protocol to allow functional use Lt UE    Time 8    Period Weeks    Status On-going    Target Date 11/28/21      PT LONG TERM GOAL #3   Title Increase strength Lt shoulder to 4/5 to 5/5 to prepare pt to return to work    Time 8    Period Weeks    Status On-going    Target Date 11/28/21      PT LONG TERM GOAL #4   Title Independent in HEP  (including aquatic exercise as indicated)    Time 8    Period Weeks    Status On-going    Target Date 11/28/21      PT LONG TERM GOAL #5   Title Improve functional limitation score to 64    Time 8    Period Weeks    Status On-going    Target Date 11/28/21                   Plan - 11/09/21 0916     Clinical Impression Statement Pt with good tolerance to addition of endurance exercises. Needed frequent rests with endurance exercises but no c/o pain. Pt will benefit from continuing work on scapular and shoulder endurance as well as strength.    PT Next Visit Plan continue progressive exercises per rehab protocol.  (11/10/20 =12 wks post op)    PT Home Exercise Plan [email or web address]    Consulted and Agree with Plan of Care Patient             Patient will benefit from skilled therapeutic intervention in order to improve the following deficits and impairments:     Visit Diagnosis: Acute pain of left shoulder  Other symptoms and signs involving the musculoskeletal system  Muscle weakness (generalized)  Abnormal posture     Problem List Patient Active Problem List   Diagnosis Date Noted   Left wrist fracture 08/11/2021    Aveyah Greenwood, PT 11/09/2021, 9:20 AM  [facility] [address] [place], [place], [address] Phone: [phone]   Fax:  [phone]  Name: Casey Wilson MRN: [id number] Date of Birth: 01-21-1976

## 2021-11-14 ENCOUNTER — Ambulatory Visit: Payer: Commercial Managed Care - PPO | Admitting: Physical Therapy

## 2021-11-14 ENCOUNTER — Other Ambulatory Visit: Payer: Self-pay

## 2021-11-14 DIAGNOSIS — R29898 Other symptoms and signs involving the musculoskeletal system: Secondary | ICD-10-CM

## 2021-11-14 DIAGNOSIS — R293 Abnormal posture: Secondary | ICD-10-CM

## 2021-11-14 DIAGNOSIS — M25512 Pain in left shoulder: Secondary | ICD-10-CM

## 2021-11-14 DIAGNOSIS — M6281 Muscle weakness (generalized): Secondary | ICD-10-CM

## 2021-11-14 DIAGNOSIS — Z9889 Other specified postprocedural states: Secondary | ICD-10-CM | POA: Diagnosis not present

## 2021-11-14 NOTE — Therapy (Signed)
[facility] Outpatient Rehabilitation [place] [address] [place] [address] [place], [place], [address] Phone: [phone]   Fax:  [phone]  Physical Therapy Treatment  Patient Details  Name: Casey Wilson MRN: [id number] Date of Birth: 01/27/1976 Referring Provider (PT): Dr Ramond Marrow   Encounter Date: 11/14/2021   PT End of Session - 11/14/21 0805     Visit Number 11    Number of Visits 16    Date for PT Re-Evaluation 11/28/21    PT Start Time 0801    PT Stop Time 0851    PT Time Calculation (min) 50 min    Activity Tolerance Patient tolerated treatment well    Behavior During Therapy [facility] for tasks assessed/performed             No past medical history on file.  Past Surgical History:  Procedure Laterality Date   ACHILLES TENDON REPAIR     Both feet   ORIF WRIST FRACTURE Left 08/11/2021   Procedure: OPEN REDUCTION INTERNAL FIXATION (ORIF) DISTAL RADIUS POSSIBLE PINNING DRUJ, POSSIBLE TRIANGULAR FIBER CARTILAGE COMPLEX REPAIR;  Surgeon: Betha Loa, MD;  Location: [facility] OR;  Service: Orthopedics;  Laterality: Left;   orthognatic     jaw repair for overbite    There were no vitals filed for this visit.   Subjective Assessment - 11/14/21 0805     Subjective Pt reports stiffness in Lt shoulder up into neck.  He thinks he may have slept wrong.    Patient Stated Goals get shoulder working again so he can return to normal function    Currently in Pain? No/denies                [facility] PT Assessment - 11/14/21 0001       Assessment   Medical Diagnosis Lt shoulder AC repair    Referring Provider (PT) Dr Ramond Marrow    Onset Date/Surgical Date 08/18/21   injury 08/11/21   Hand Dominance Right    Next MD Visit 11/18/20    Prior Therapy .      AROM   Left Shoulder External Rotation 58 Degrees   supported scaption in supine, cane AAROM             [facility] Adult PT Treatment/Exercise - 11/14/21 0001       Shoulder Exercises: Supine   External  Rotation Left;AAROM;5 reps   supported scaption, cane. 10 sec holds.   External Rotation Limitations trialed resisted ER, limited range - switched to standing with red band    ABduction AAROM;Left;5 reps   cane, 10 sec holds   Diagonals Strengthening;Left;10 reps    Theraband Level (Shoulder Diagonals) Level 2 (Red)      Shoulder Exercises: Standing   External Rotation Left;10 reps;Theraband    Theraband Level (Shoulder External Rotation) Level 2 (Red)    Internal Rotation AAROM;Both;10 reps   cane behind back   Theraband Level (Shoulder Internal Rotation) Level 2 (Red)   x 10   Flexion Strengthening;Left;10 reps;Theraband   forward punch   Theraband Level (Shoulder Flexion) Level 2 (Red)    Row Strengthening;Left;10 reps    Theraband Level (Shoulder Row) Level 3 (Green)      Shoulder Exercises: ROM/Strengthening   UBE (Upper Arm Bike) L1: 2 min forward, 2 min backward.    Wall Pushups 10 reps   partial range due to limited Lt wrist flexion     Shoulder Exercises: Stretch   Wall Stretch - Flexion 4 reps;10 seconds  Other Shoulder Stretches Lt shoulder pec stretch - mid doorway stretch    Other Shoulder Stretches Lt shoulder ext stretch holding door frame x 2, 30 sec      Moist Heat Therapy   Number Minutes Moist Heat 10 Minutes    Moist Heat Location Cervical      Electrical Stimulation   Electrical Stimulation Location Lt upper trap/ neck/ upper shoulder    Electrical Stimulation Action IFC    Electrical Stimulation Parameters intensity to tolerance, 10 min    Electrical Stimulation Goals Pain      Manual Therapy   Soft tissue mobilization STM to Lt upper trap, suboccipitals and paraspinals to decrease restriction and improve mobility.                       PT Short Term Goals - 10/12/21 1526       PT SHORT TERM GOAL #1   Title Complete shoulder evaluation assessing ROM Lt shoulder as indicated    Time 2    Period Weeks    Status Achieved    Target Date  10/17/21      PT SHORT TERM GOAL #2   Title Instruct patient in initial HEP for Lt shoulder dysfunction following AC dislocation and Lt radius fx    Time 4    Period Weeks    Status Achieved    Target Date 11/01/21               PT Long Term Goals - 10/18/21 0931       PT LONG TERM GOAL #1   Title Improve posture and alignment with patient to demonstrate improved upright posture with posterior shoulder girdle musculature engaged    Time 8    Period Weeks    Status On-going    Target Date 11/28/21      PT LONG TERM GOAL #2   Title Increase AROM Lt shoulder per MD protocol to allow functional use Lt UE    Time 8    Period Weeks    Status On-going    Target Date 11/28/21      PT LONG TERM GOAL #3   Title Increase strength Lt shoulder to 4/5 to 5/5 to prepare pt to return to work    Time 8    Period Weeks    Status On-going    Target Date 11/28/21      PT LONG TERM GOAL #4   Title Independent in HEP (including aquatic exercise as indicated)    Time 8    Period Weeks    Status On-going    Target Date 11/28/21      PT LONG TERM GOAL #5   Title Improve functional limitation score to 64    Time 8    Period Weeks    Status On-going    Target Date 11/28/21                   Plan - 11/14/21 0851     Clinical Impression Statement Pt tolerated new band exercises for Lt shoulder well, without any issues.  Continued tightness in Lt ER and abdct ROM. Pt voiced interested in DN in future visit (for tricep tightness). Progressing well within rehab protocol.    Rehab Potential Good    PT Frequency 2x / week    PT Duration 8 weeks    PT Treatment/Interventions ADLs/Self Care Home Management;Aquatic Therapy;Cryotherapy;Electrical Stimulation;Iontophoresis 4mg /ml Dexamethasone;Moist Heat;Ultrasound;Functional mobility training;Therapeutic activities;Therapeutic exercise;Neuromuscular  re-education;Patient/family education;Manual techniques;Scar mobilization;Passive  range of motion;Dry needling;Vasopneumatic Device    PT Next Visit Plan continue progressive exercises per rehab protocol.  Measure and assess goals. MD note.    PT Home Exercise Plan [email or web address]    Consulted and Agree with Plan of Care Patient             Patient will benefit from skilled therapeutic intervention in order to improve the following deficits and impairments:  Decreased range of motion, Increased fascial restricitons, Impaired UE functional use, Decreased activity tolerance, Pain, Impaired flexibility, Improper body mechanics, Decreased mobility, Decreased strength, Increased edema, Postural dysfunction  Visit Diagnosis: Acute pain of left shoulder  Other symptoms and signs involving the musculoskeletal system  Muscle weakness (generalized)  Abnormal posture     Problem List Patient Active Problem List   Diagnosis Date Noted   Left wrist fracture 08/11/2021   Mayer Camel, PTA 11/14/21 12:32 PM   [facility] [place] [address] [place] [place], [place], [address] Phone: [phone]   Fax:  [phone]  Name: Casey Wilson MRN: [id number] Date of Birth: 09/13/1976

## 2021-11-14 NOTE — Patient Instructions (Signed)
Access Code: [email or web address] URL: [email or web address] Date: 11/14/2021 Prepared by: [facility] [place]  Exercises Seated Cervical Sidebending AROM - 2 x daily - 7 x weekly - 1 sets - 5 reps - 5-10 sec hold Supine Shoulder External Rotation in 45 Degrees Abduction AAROM with Dowel - 2 x daily - 7 x weekly - 1 sets - 10 reps - 5-10 seconds hold Standing Shoulder Internal Rotation Stretch with Hands Behind Back - 2 x daily - 7 x weekly - 1 sets - 3 reps - 20 seconds hold Standing Shoulder Flexion Stretch on Wall - 1 x daily - 7 x weekly - 1 sets - 10 reps - 3-5 second hold Standing Shoulder Abduction AAROM with Dowel - 1 x daily - 7 x weekly - 1-3 sets - 10 reps - 1-2second hold Standing Shoulder Extension with Dowel - 1 x daily - 7 x weekly - 1 sets - 10 reps Shoulder External Rotation with Anchored Resistance - 1 x daily - 7 x weekly - 2 sets - 10 reps Shoulder Internal Rotation with Resistance - 1 x daily - 7 x weekly - 2 sets - 10 reps Standing Bilateral Low Shoulder Row with Anchored Resistance - 1 x daily - 7 x weekly - 2 sets - 10 reps

## 2021-11-16 ENCOUNTER — Other Ambulatory Visit: Payer: Self-pay

## 2021-11-16 ENCOUNTER — Ambulatory Visit: Payer: Commercial Managed Care - PPO | Admitting: Physical Therapy

## 2021-11-16 DIAGNOSIS — R29898 Other symptoms and signs involving the musculoskeletal system: Secondary | ICD-10-CM

## 2021-11-16 DIAGNOSIS — Z9889 Other specified postprocedural states: Secondary | ICD-10-CM | POA: Diagnosis not present

## 2021-11-16 DIAGNOSIS — R293 Abnormal posture: Secondary | ICD-10-CM

## 2021-11-16 DIAGNOSIS — M6281 Muscle weakness (generalized): Secondary | ICD-10-CM

## 2021-11-16 DIAGNOSIS — M25512 Pain in left shoulder: Secondary | ICD-10-CM

## 2021-11-16 NOTE — Therapy (Signed)
[facility] [place] [address] [place], [place], [address] Phone: [phone]   Fax:  [phone]  Physical Therapy Treatment  Patient Details  Name: Casey Wilson MRN: [id number] Date of Birth: October 15, 1976 Referring Provider (PT): Dr Ramond Marrow   Encounter Date: 11/16/2021   PT End of Session - 11/16/21 0839     Visit Number 12    Number of Visits 16    Date for PT Re-Evaluation 11/28/21    PT Start Time 0800    PT Stop Time 0845    PT Time Calculation (min) 45 min    Activity Tolerance Patient tolerated treatment well    Behavior During Therapy [facility] for tasks assessed/performed             No past medical history on file.  Past Surgical History:  Procedure Laterality Date   ACHILLES TENDON REPAIR     Both feet   ORIF WRIST FRACTURE Left 08/11/2021   Procedure: OPEN REDUCTION INTERNAL FIXATION (ORIF) DISTAL RADIUS POSSIBLE PINNING DRUJ, POSSIBLE TRIANGULAR FIBER CARTILAGE COMPLEX REPAIR;  Surgeon: Betha Loa, MD;  Location: [facility] OR;  Service: Orthopedics;  Laterality: Left;   orthognatic     jaw repair for overbite    There were no vitals filed for this visit.   Subjective Assessment - 11/16/21 0800     Subjective Pt reports no pain, just "stiff"    Patient Stated Goals get shoulder working again so he can return to normal function    Currently in Pain? No/denies                [facility] PT Assessment - 11/16/21 0001       Assessment   Medical Diagnosis Lt shoulder AC repair    Referring Provider (PT) Dr Ramond Marrow    Onset Date/Surgical Date 08/18/21   injury 08/11/21   Hand Dominance Right    Next MD Visit 11/18/20      AROM   Left Shoulder Flexion 155 Degrees    Left Shoulder Internal Rotation 70 Degrees    Left Shoulder External Rotation 48 Degrees                           [facility] Adult PT Treatment/Exercise - 11/16/21 0001       Shoulder Exercises: Supine   Protraction 10  reps    Protraction Weight (lbs) 3#      Shoulder Exercises: Standing   External Rotation 20 reps    Theraband Level (Shoulder External Rotation) Level 2 (Red)    Internal Rotation 20 reps    Theraband Level (Shoulder Internal Rotation) Level 2 (Red)    Flexion 10 reps    Shoulder Flexion Weight (lbs) 3#    Flexion Limitations then scaption 3#  x10    Extension 20 reps    Theraband Level (Shoulder Extension) Level 3 (Green)    Row 20 reps    Theraband Level (Shoulder Row) Level 3 (Green)    Other Standing Exercises suitcase carry 8# x 2 laps, 90/90 carry 4# x 2 laps, ball bounce on wall 2 x 30 sec, ball circles on wall 30 sec each CW/CCW      Shoulder Exercises: Pulleys   Flexion 2 minutes    Scaption 2 minutes      Shoulder Exercises: Stretch   Other Shoulder Stretches Lt shoulder pec stretch - mid doorway stretch      Vasopneumatic   Number  Minutes Vasopneumatic  10 minutes    Vasopnuematic Location  Shoulder    Vasopneumatic Pressure Medium    Vasopneumatic Temperature  34      Manual Therapy   Soft tissue mobilization STM Lt pecs to reduce spasticity and improve ROM    Passive ROM Lt shoulder ER with contract/relax                       PT Short Term Goals - 10/12/21 1526       PT SHORT TERM GOAL #1   Title Complete shoulder evaluation assessing ROM Lt shoulder as indicated    Time 2    Period Weeks    Status Achieved    Target Date 10/17/21      PT SHORT TERM GOAL #2   Title Instruct patient in initial HEP for Lt shoulder dysfunction following AC dislocation and Lt radius fx    Time 4    Period Weeks    Status Achieved    Target Date 11/01/21               PT Long Term Goals - 10/18/21 0931       PT LONG TERM GOAL #1   Title Improve posture and alignment with patient to demonstrate improved upright posture with posterior shoulder girdle musculature engaged    Time 8    Period Weeks    Status On-going    Target Date 11/28/21       PT LONG TERM GOAL #2   Title Increase AROM Lt shoulder per MD protocol to allow functional use Lt UE    Time 8    Period Weeks    Status On-going    Target Date 11/28/21      PT LONG TERM GOAL #3   Title Increase strength Lt shoulder to 4/5 to 5/5 to prepare pt to return to work    Time 8    Period Weeks    Status On-going    Target Date 11/28/21      PT LONG TERM GOAL #4   Title Independent in HEP (including aquatic exercise as indicated)    Time 8    Period Weeks    Status On-going    Target Date 11/28/21      PT LONG TERM GOAL #5   Title Improve functional limitation score to 64    Time 8    Period Weeks    Status On-going    Target Date 11/28/21                   Plan - 11/16/21 0839     Clinical Impression Statement Pt with good tolerance of endurance exercises with no increase in pain, frequent rest breaks due to fatigue. Pt continues with decreased abduction and ER ROM, much improved flexion and IR    PT Next Visit Plan continue progressive exercises per rehab protocol.    PT Home Exercise Plan [email or web address]    Consulted and Agree with Plan of Care Patient             Patient will benefit from skilled therapeutic intervention in order to improve the following deficits and impairments:     Visit Diagnosis: Acute pain of left shoulder  Other symptoms and signs involving the musculoskeletal system  Muscle weakness (generalized)  Abnormal posture     Problem List Patient Active Problem List   Diagnosis Date Noted   Left wrist fracture 08/11/2021  Glenis Musolf, PT 11/16/2021, 8:41 AM  [facility] [address] [place], [place], [address] Phone: [phone]   Fax:  [phone]  Name: Casey Wilson MRN: [id number] Date of Birth: 10/28/1976

## 2021-11-21 ENCOUNTER — Other Ambulatory Visit: Payer: Self-pay

## 2021-11-21 ENCOUNTER — Encounter: Payer: Self-pay | Admitting: Rehabilitative and Restorative Service Providers"

## 2021-11-21 ENCOUNTER — Ambulatory Visit: Payer: Commercial Managed Care - PPO | Admitting: Rehabilitative and Restorative Service Providers"

## 2021-11-21 DIAGNOSIS — Z9889 Other specified postprocedural states: Secondary | ICD-10-CM | POA: Diagnosis not present

## 2021-11-21 DIAGNOSIS — R29898 Other symptoms and signs involving the musculoskeletal system: Secondary | ICD-10-CM

## 2021-11-21 DIAGNOSIS — M25512 Pain in left shoulder: Secondary | ICD-10-CM

## 2021-11-21 DIAGNOSIS — M6281 Muscle weakness (generalized): Secondary | ICD-10-CM

## 2021-11-21 DIAGNOSIS — R293 Abnormal posture: Secondary | ICD-10-CM

## 2021-11-21 NOTE — Patient Instructions (Addendum)
Trigger Point Dry Needling  What is Trigger Point Dry Needling (DN)? DN is a physical therapy technique used to treat muscle pain and dysfunction. Specifically, DN helps deactivate muscle trigger points (muscle knots).  A thin filiform needle is used to penetrate the skin and stimulate the underlying trigger point. The goal is for a local twitch response (LTR) to occur and for the trigger point to relax. No medication of any kind is injected during the procedure.   What Does Trigger Point Dry Needling Feel Like?  The procedure feels different for each individual patient. Some patients report that they do not actually feel the needle enter the skin and overall the process is not painful. Very mild bleeding may occur. However, many patients feel a deep cramping in the muscle in which the needle was inserted. This is the local twitch response.   How Will I feel after the treatment? Soreness is normal, and the onset of soreness may not occur for a few hours. Typically this soreness does not last longer than two days.  Bruising is uncommon, however; ice can be used to decrease any possible bruising.  In rare cases feeling tired or nauseous after the treatment is normal. In addition, your symptoms may get worse before they get better, this period will typically not last longer than 24 hours.   What Can I do After My Treatment? Increase your hydration by drinking more water for the next 24 hours. You may place ice or heat on the areas treated that have become sore, however, do not use heat on inflamed or bruised areas. Heat often brings more relief post needling. You can continue your regular activities, but vigorous activity is not recommended initially after the treatment for 24 hours. DN is best combined with other physical therapy such as strengthening, stretching, and other therapies.   Access Code: [email or web address] URL: [email or web address] Date: 11/21/2021 Prepared by: Corlis Leak  Exercises Seated Cervical Sidebending AROM - 2 x daily - 7 x weekly - 1 sets - 5 reps - 5-10 sec hold Supine Shoulder External Rotation in 45 Degrees Abduction AAROM with Dowel - 2 x daily - 7 x weekly - 1 sets - 10 reps - 5-10 seconds hold Standing Shoulder Internal Rotation Stretch with Hands Behind Back - 2 x daily - 7 x weekly - 1 sets - 3 reps - 20 seconds hold Standing Shoulder Flexion Stretch on Wall - 1 x daily - 7 x weekly - 1 sets - 10 reps - 3-5 second hold Standing Shoulder Abduction AAROM with Dowel - 1 x daily - 7 x weekly - 1-3 sets - 10 reps - 1-2second hold Standing Shoulder Extension with Dowel - 1 x daily - 7 x weekly - 1 sets - 10 reps Shoulder External Rotation with Anchored Resistance - 1 x daily - 7 x weekly - 2 sets - 10 reps Shoulder Internal Rotation with Resistance - 1 x daily - 7 x weekly - 2 sets - 10 reps Standing Bilateral Low Shoulder Row with Anchored Resistance - 1 x daily - 7 x weekly - 2 sets - 10 reps Doorway Pec Stretch at 60 Degrees Abduction - 3 x daily - 7 x weekly - 1 sets - 3 reps Doorway Pec Stretch at 90 Degrees Abduction - 3 x daily - 7 x weekly - 1 sets - 3 reps - 30 seconds hold Doorway Pec Stretch at 120 Degrees Abduction - 3 x daily - 7 x weekly - 1 sets -  3 reps - 30 second hold hold Standing Shoulder Flexion Wall Walk - 2 x daily - 7 x weekly - 1 sets - 3 reps - 30 sec hold

## 2021-11-21 NOTE — Therapy (Addendum)
[place], [place], [address] Phone: [phone]   Fax:  [phone]  Physical Therapy Treatment  Patient Details  Name: Casey Wilson MRN: [id number] Date of Birth: 01-Apr-1976 Referring Provider (PT): Dr Ophelia Charter   Encounter Date: 11/21/2021   PT End of Session - 11/21/21 0845     Visit Number 13    Number of Visits 16    Date for PT Re-Evaluation 11/28/21    PT Start Time 0842    PT Stop Time 0932    PT Time Calculation (min) 50 min    Activity Tolerance Patient tolerated treatment well             History reviewed. No pertinent past medical history.  Past Surgical History:  Procedure Laterality Date   ACHILLES TENDON REPAIR     Both feet   ORIF WRIST FRACTURE Left 08/11/2021   Procedure: OPEN REDUCTION INTERNAL FIXATION (ORIF) DISTAL RADIUS POSSIBLE PINNING DRUJ, POSSIBLE TRIANGULAR FIBER CARTILAGE COMPLEX REPAIR;  Surgeon: Leanora Cover, MD;  Location: [facility];  Service: Orthopedics;  Laterality: Left;   orthognatic     jaw repair for overbite    There were no vitals filed for this visit.   Subjective Assessment - 11/21/21 0845     Subjective Saw Dr Griffin Basil and he is ready for pt to start strengthening. Okay for DN. No pain but has continued stiffness.    Currently in Pain? No/denies    Pain Score 0-No pain    Pain Location Shoulder    Pain Orientation Left    Pain Descriptors / Indicators Tightness                               [facility] Adult PT Treatment/Exercise - 11/21/21 0001       Shoulder Exercises: ROM/Strengthening   UBE (Upper Arm Bike) L6 x 4 min 2 min fwd, 2 min back      Shoulder Exercises: Stretch   Wall Stretch - Flexion 2 reps;20 seconds;30 seconds    Wall Stretch - Flexion Limitations hands over doorway    Other Shoulder Stretches doorway stretch bilat mid level and higher level x 20-30 sec x 3 reps      Moist Heat Therapy   Number  Minutes Moist Heat 10 Minutes    Moist Heat Location Shoulder      Manual Therapy   Manual therapy comments skilled palpation to assess response to DN and manual work    Soft tissue mobilization deep tissue work through the pecs; clavicular area; biceps; triceps    Passive ROM Lt shoulder flexion; scaption; ER in scaption            Dry needling: patient received information on TPDN  DN to Lt upper trap; pecs; biceps   E-stim - mAmp x 5 min, microcurrent x 5 min   Tolerated well   Trigger Point Dry Needling - 11/21/21 0001     Consent Given? Yes    Education Handout Provided Yes    Muscles Treated Head and Neck Upper trapezius    Muscles Treated Upper Quadrant Pectoralis major;Pectoralis minor;Deltoid;Biceps    Electrical Stimulation Performed with Dry Needling Yes    Upper Trapezius Response Palpable increased muscle length;Twitch reponse elicited    Pectoralis Major Response Palpable increased muscle length    Pectoralis Minor Response Palpable increased muscle length    Deltoid Response Palpable increased muscle  length    Biceps Response Palpable increased muscle length                   PT Education - 11/21/21 0918     Education Details HEP DN    Person(s) Educated Patient    Methods Explanation;Demonstration;Tactile cues;Verbal cues;Handout    Comprehension Verbalized understanding;Returned demonstration;Verbal cues required;Tactile cues required              PT Short Term Goals - 10/12/21 1526       PT SHORT TERM GOAL #1   Title Complete shoulder evaluation assessing ROM Lt shoulder as indicated    Time 2    Period Weeks    Status Achieved    Target Date 10/17/21      PT SHORT TERM GOAL #2   Title Instruct patient in initial HEP for Lt shoulder dysfunction following AC dislocation and Lt radius fx    Time 4    Period Weeks    Status Achieved    Target Date 11/01/21               PT Long Term Goals - 10/18/21 0931       PT LONG  TERM GOAL #1   Title Improve posture and alignment with patient to demonstrate improved upright posture with posterior shoulder girdle musculature engaged    Time 8    Period Weeks    Status On-going    Target Date 11/28/21      PT LONG TERM GOAL #2   Title Increase AROM Lt shoulder per MD protocol to allow functional use Lt UE    Time 8    Period Weeks    Status On-going    Target Date 11/28/21      PT LONG TERM GOAL #3   Title Increase strength Lt shoulder to 4/5 to 5/5 to prepare pt to return to work    Time 8    Period Weeks    Status On-going    Target Date 11/28/21      PT LONG TERM GOAL #4   Title Independent in HEP (including aquatic exercise as indicated)    Time 8    Period Weeks    Status On-going    Target Date 11/28/21      PT LONG TERM GOAL #5   Title Improve functional limitation score to 64    Time 8    Period Weeks    Status On-going    Target Date 11/28/21                   Plan - 11/21/21 0848     Clinical Impression Statement MD order to progress with strengthening and try DN. Patient added stretching and strengthening. Tolerated DN well. Continues to progress well toward stated goals of rehab.    Rehab Potential Good    PT Frequency 2x / week    PT Duration 8 weeks    PT Treatment/Interventions ADLs/Self Care Home Management;Aquatic Therapy;Cryotherapy;Electrical Stimulation;Iontophoresis 4mg /ml Dexamethasone;Moist Heat;Ultrasound;Functional mobility training;Therapeutic activities;Therapeutic exercise;Neuromuscular re-education;Patient/family education;Manual techniques;Scar mobilization;Passive range of motion;Dry needling;Vasopneumatic Device    PT Next Visit Plan continue progressive exercises per rehab protocol - progress strengthening; assess response to DN and manual work    Sims and Agree with Plan of Care Patient             Patient will benefit from skilled therapeutic intervention in  order to improve  the following deficits and impairments:     Visit Diagnosis: Acute pain of left shoulder  Other symptoms and signs involving the musculoskeletal system  Muscle weakness (generalized)  Abnormal posture     Problem List Patient Active Problem List   Diagnosis Date Noted   Left wrist fracture 08/11/2021    Avory Rahimi Nilda Simmer, PT, MPH  11/21/2021, 9:48 AM  [facility] [place], [place], [address] Phone: [phone]   Fax:  [phone]  Name: Jasiah Vinas MRN: [id number] Date of Birth: [birth year]

## 2021-11-25 ENCOUNTER — Ambulatory Visit: Payer: Commercial Managed Care - PPO | Admitting: Physical Therapy

## 2021-11-25 ENCOUNTER — Other Ambulatory Visit: Payer: Self-pay

## 2021-11-25 DIAGNOSIS — M6281 Muscle weakness (generalized): Secondary | ICD-10-CM

## 2021-11-25 DIAGNOSIS — R293 Abnormal posture: Secondary | ICD-10-CM

## 2021-11-25 DIAGNOSIS — R29898 Other symptoms and signs involving the musculoskeletal system: Secondary | ICD-10-CM

## 2021-11-25 DIAGNOSIS — M25512 Pain in left shoulder: Secondary | ICD-10-CM

## 2021-11-25 DIAGNOSIS — Z9889 Other specified postprocedural states: Secondary | ICD-10-CM | POA: Diagnosis not present

## 2021-11-25 NOTE — Therapy (Signed)
[place], [place], [address] Phone: [phone]   Fax:  [phone]  Physical Therapy Treatment  Patient Details  Name: Casey Wilson MRN: [id number] Date of Birth: 05-01-1976 Referring Provider (PT): Dr Ophelia Charter   Encounter Date: 11/25/2021   PT End of Session - 11/25/21 0841     Visit Number 14    Number of Visits 16    Date for PT Re-Evaluation 11/28/21    PT Start Time 0756    PT Stop Time 0840    PT Time Calculation (min) 44 min    Activity Tolerance Patient tolerated treatment well    Behavior During Therapy [facility] for tasks assessed/performed             No past medical history on file.  Past Surgical History:  Procedure Laterality Date   ACHILLES TENDON REPAIR     Both feet   ORIF WRIST FRACTURE Left 08/11/2021   Procedure: OPEN REDUCTION INTERNAL FIXATION (ORIF) DISTAL RADIUS POSSIBLE PINNING DRUJ, POSSIBLE TRIANGULAR FIBER CARTILAGE COMPLEX REPAIR;  Surgeon: Leanora Cover, MD;  Location: [facility];  Service: Orthopedics;  Laterality: Left;   orthognatic     jaw repair for overbite    There were no vitals filed for this visit.   Subjective Assessment - 11/25/21 0759     Subjective Pt states he felt like dry needling helped his shoulder feel more "loose". He has not tried any lifting on his own    Patient Stated Goals get shoulder working again so he can return to normal function    Currently in Pain? No/denies                               [facility] Adult PT Treatment/Exercise - 11/25/21 0001       Shoulder Exercises: Standing   External Rotation 20 reps    Theraband Level (Shoulder External Rotation) Level 3 (Green)    Internal Rotation 20 reps    Theraband Level (Shoulder Internal Rotation) Level 3 (Green)    Flexion Limitations double UE overhead press 10# x 10    Extension 20 reps    Theraband Level (Shoulder Extension) Level 3 (Green)    Row 20  reps    Theraband Level (Shoulder Row) Level 3 (Green)    Other Standing Exercises standing ring arc x 2 with 3# wt LT UE, 90/90 carry 5# x 2 laps    Other Standing Exercises ball bounce on wall green physioball x 1 min, ball on wall circles CW/CCW x 30 sec each, 2 laps farmers carry 10#, 2 laps suitcase carry 10#      Shoulder Exercises: ROM/Strengthening   UBE (Upper Arm Bike) L6 x 6 min alt fwd/bkwd      Shoulder Exercises: Stretch   Wall Stretch - Flexion 2 reps;30 seconds    Wall Stretch - Flexion Limitations hands over doorway    Other Shoulder Stretches doorway stretch bilat mid level and higher level x 20-30 sec x 3 reps      Manual Therapy   Passive ROM Lt shoulder flexion; scaption; ER in scaption                       PT Short Term Goals - 10/12/21 1526       PT SHORT TERM GOAL #1   Title Complete shoulder evaluation assessing ROM Lt shoulder as indicated  Time 2    Period Weeks    Status Achieved    Target Date 10/17/21      PT SHORT TERM GOAL #2   Title Instruct patient in initial HEP for Lt shoulder dysfunction following AC dislocation and Lt radius fx    Time 4    Period Weeks    Status Achieved    Target Date 11/01/21               PT Long Term Goals - 10/18/21 0931       PT LONG TERM GOAL #1   Title Improve posture and alignment with patient to demonstrate improved upright posture with posterior shoulder girdle musculature engaged    Time 8    Period Weeks    Status On-going    Target Date 11/28/21      PT LONG TERM GOAL #2   Title Increase AROM Lt shoulder per MD protocol to allow functional use Lt UE    Time 8    Period Weeks    Status On-going    Target Date 11/28/21      PT LONG TERM GOAL #3   Title Increase strength Lt shoulder to 4/5 to 5/5 to prepare pt to return to work    Time 8    Period Weeks    Status On-going    Target Date 11/28/21      PT LONG TERM GOAL #4   Title Independent in HEP (including aquatic  exercise as indicated)    Time 8    Period Weeks    Status On-going    Target Date 11/28/21      PT LONG TERM GOAL #5   Title Improve functional limitation score to 64    Time 8    Period Weeks    Status On-going    Target Date 11/28/21                   Plan - 11/25/21 0841     Clinical Impression Statement Session focused on strength and endurance training. Pt requires frequent rests during shoulder level and overhead activities, cues for posture with sustained hold and carry.    PT Next Visit Plan RECERT, strength and endurance training    PT Home Exercise Plan [id number]    Consulted and Agree with Plan of Care Patient             Patient will benefit from skilled therapeutic intervention in order to improve the following deficits and impairments:     Visit Diagnosis: Acute pain of left shoulder  Other symptoms and signs involving the musculoskeletal system  Muscle weakness (generalized)  Abnormal posture     Problem List Patient Active Problem List   Diagnosis Date Noted   Left wrist fracture 08/11/2021    Faheem Ziemann, PT 11/25/2021, 8:43 AM  [facility] [place], [place], [address] Phone: [phone]   Fax:  [phone]  Name: Casey Wilson MRN: [id number] Date of Birth: 22-Dec-1975

## 2021-11-28 ENCOUNTER — Other Ambulatory Visit: Payer: Self-pay

## 2021-11-28 ENCOUNTER — Encounter: Payer: Self-pay | Admitting: Rehabilitative and Restorative Service Providers"

## 2021-11-28 ENCOUNTER — Ambulatory Visit: Payer: Commercial Managed Care - PPO | Admitting: Rehabilitative and Restorative Service Providers"

## 2021-11-28 DIAGNOSIS — Z9889 Other specified postprocedural states: Secondary | ICD-10-CM | POA: Diagnosis not present

## 2021-11-28 DIAGNOSIS — M6281 Muscle weakness (generalized): Secondary | ICD-10-CM

## 2021-11-28 DIAGNOSIS — M25512 Pain in left shoulder: Secondary | ICD-10-CM

## 2021-11-28 DIAGNOSIS — R293 Abnormal posture: Secondary | ICD-10-CM

## 2021-11-28 DIAGNOSIS — R29898 Other symptoms and signs involving the musculoskeletal system: Secondary | ICD-10-CM

## 2021-11-28 NOTE — Therapy (Signed)
[facility] [place] [address] [place], [place], [address] Phone: [phone]   Fax:  [phone]  Physical Therapy Treatment  Patient Details  Name: Casey Wilson MRN: [id number] Date of Birth: [birth year] Referring Provider (PT): Dr Ramond Marrow   Encounter Date: 11/28/2021   PT End of Session - 11/28/21 0845     Visit Number 15    Number of Visits 24    Date for PT Re-Evaluation 01/09/22    PT Start Time 0838    PT Stop Time 0930    PT Time Calculation (min) 52 min    Activity Tolerance Patient tolerated treatment well             History reviewed. No pertinent past medical history.  Past Surgical History:  Procedure Laterality Date   ACHILLES TENDON REPAIR     Both feet   ORIF WRIST FRACTURE Left 08/11/2021   Procedure: OPEN REDUCTION INTERNAL FIXATION (ORIF) DISTAL RADIUS POSSIBLE PINNING DRUJ, POSSIBLE TRIANGULAR FIBER CARTILAGE COMPLEX REPAIR;  Surgeon: Betha Loa, MD;  Location: [facility];  Service: Orthopedics;  Laterality: Left;   orthognatic     jaw repair for overbite    There were no vitals filed for this visit.   Subjective Assessment - 11/28/21 0848     Subjective Patient reports that he continues to progress. Wrist is stiff and tight. Patient continues to work on exercises at home.    Currently in Pain? No/denies    Pain Score 0-No pain                [facility] PT Assessment - 11/28/21 0001       Assessment   Medical Diagnosis Lt shoulder AC repair    Referring Provider (PT) Dr Ramond Marrow    Onset Date/Surgical Date 08/18/21   injury 08/11/21   Hand Dominance Right    Next MD Visit 12/16/21      AROM   Right Shoulder Extension 53 Degrees    Right Shoulder Flexion 152 Degrees    Right Shoulder ABduction 150 Degrees    Right Shoulder External Rotation 55 Degrees    Left Shoulder Extension 50 Degrees   UE at side   Left Shoulder Flexion 141 Degrees    Left Shoulder ABduction 143 Degrees     Left Shoulder Internal Rotation --   thumb to T 12   Left Shoulder External Rotation 53 Degrees   UE at side     Strength   Left Shoulder Flexion 4+/5    Left Shoulder Extension 4+/5    Left Shoulder ABduction 4+/5    Left Shoulder Internal Rotation 4+/5    Left Shoulder External Rotation 4+/5      Palpation   Palpation comment tightness to palpation through the anterior Lt shoulder pecs/clavicular area                           [facility] Adult PT Treatment/Exercise - 11/28/21 0001       Shoulder Exercises: Standing   External Rotation Strengthening;Left;20 reps;Theraband    Theraband Level (Shoulder External Rotation) Level 3 (Green)    External Rotation Limitations seated ET from 90 deg shd flexion woring neutral ER to 90 deg red TB elbow resting on table    Internal Rotation Strengthening;Left;20 reps;Theraband    Theraband Level (Shoulder Internal Rotation) Level 3 (Green)    Flexion Limitations double UE overhead press 10# x 10 x 2 sets wts  on bamboo pole    Row 20 reps    Theraband Level (Shoulder Row) Level 4 (Blue)    Row Limitations bow and arrow x 20 Lt blue TB    Shoulder Elevation Strengthening;Left;15 reps;Standing    Shoulder Elevation Limitations press up 5# KB secured with red TB    Other Standing Exercises standing UE supported on inverted BOSU for wt bearing activities x 3 min with rest as needed; overhead press 5# bilat x 20 reps      Shoulder Exercises: ROM/Strengthening   UBE (Upper Arm Bike) L6 x 8 min alt fwd/bkwd      Shoulder Exercises: Stretch   Other Shoulder Stretches doorway stretch bilat mid level and higher level x 20-30 sec x 3 reps                       PT Short Term Goals - 10/12/21 1526       PT SHORT TERM GOAL #1   Title Complete shoulder evaluation assessing ROM Lt shoulder as indicated    Time 2    Period Weeks    Status Achieved    Target Date 10/17/21      PT SHORT TERM GOAL #2   Title Instruct  patient in initial HEP for Lt shoulder dysfunction following AC dislocation and Lt radius fx    Time 4    Period Weeks    Status Achieved    Target Date 11/01/21               PT Long Term Goals - 11/28/21 0902       PT LONG TERM GOAL #1   Title Improve posture and alignment with patient to demonstrate improved upright posture with posterior shoulder girdle musculature engaged    Time 6    Period Weeks    Status On-going    Target Date 01/09/22      PT LONG TERM GOAL #2   Title Increase AROM Lt shoulder to WNL's to allow functional use Lt UE    Time 6    Period Weeks    Status Revised    Target Date 01/09/22      PT LONG TERM GOAL #3   Title Increase strength Lt shoulder to 5/5 to prepare pt to return to work    Time 6    Period Weeks    Status Revised    Target Date 01/09/22      PT LONG TERM GOAL #4   Title Independent in HEP (including aquatic exercise as indicated)    Time 6    Period Weeks    Status On-going    Target Date 01/09/22      PT LONG TERM GOAL #5   Title Improve functional limitation score to 64    Time 6    Period Weeks    Status On-going    Target Date 01/09/22                   Plan - 11/28/21 0851     Clinical Impression Statement Continued progress with ROM and strength Lt UE. Working on Print production plannerstrengthening and functional activities to prepare for return to work. Progressing well toward stated goals of therapy.    Rehab Potential Good    PT Frequency 2x / week    PT Duration 6 weeks    PT Treatment/Interventions ADLs/Self Care Home Management;Aquatic Therapy;Cryotherapy;Electrical Stimulation;Iontophoresis 4mg /ml Dexamethasone;Moist Heat;Ultrasound;Functional mobility training;Therapeutic activities;Therapeutic exercise;Neuromuscular re-education;Patient/family education;Manual techniques;Scar  mobilization;Passive range of motion;Dry needling;Vasopneumatic Device    PT Next Visit Plan strength and endurance training    PT Home  Exercise Plan [id number]    Consulted and Agree with Plan of Care Patient             Patient will benefit from skilled therapeutic intervention in order to improve the following deficits and impairments:     Visit Diagnosis: Acute pain of left shoulder  Other symptoms and signs involving the musculoskeletal system  Muscle weakness (generalized)  Abnormal posture     Problem List Patient Active Problem List   Diagnosis Date Noted   Left wrist fracture 08/11/2021    Alroy Portela Rober Minion, PT, MPH  11/28/2021, 9:30 AM  [facility] [address] [place], [place], [address] Phone: [phone]   Fax:  [phone]  Name: Casey Wilson MRN: [id number] Date of Birth: April 13, 1976

## 2021-11-30 ENCOUNTER — Ambulatory Visit: Payer: Commercial Managed Care - PPO | Admitting: Physical Therapy

## 2021-11-30 ENCOUNTER — Other Ambulatory Visit: Payer: Self-pay

## 2021-11-30 DIAGNOSIS — Z9889 Other specified postprocedural states: Secondary | ICD-10-CM | POA: Diagnosis not present

## 2021-11-30 DIAGNOSIS — R293 Abnormal posture: Secondary | ICD-10-CM

## 2021-11-30 DIAGNOSIS — R29898 Other symptoms and signs involving the musculoskeletal system: Secondary | ICD-10-CM

## 2021-11-30 DIAGNOSIS — M6281 Muscle weakness (generalized): Secondary | ICD-10-CM

## 2021-11-30 DIAGNOSIS — M25512 Pain in left shoulder: Secondary | ICD-10-CM

## 2021-11-30 NOTE — Therapy (Signed)
[place], [place], [address] Phone: [phone]   Fax:  [phone]  Physical Therapy Treatment  Patient Details  Name: Casey Wilson MRN: [id number] Date of Birth: Jun 13, 1976 Referring Provider (PT): Dr Ophelia Charter   Encounter Date: 11/30/2021   PT End of Session - 11/30/21 0930     Visit Number 16    Number of Visits 24    Date for PT Re-Evaluation 01/09/22    PT Start Time 0845    PT Stop Time 0925    PT Time Calculation (min) 40 min    Activity Tolerance Patient tolerated treatment well    Behavior During Therapy [facility] for tasks assessed/performed             No past medical history on file.  Past Surgical History:  Procedure Laterality Date   ACHILLES TENDON REPAIR     Both feet   ORIF WRIST FRACTURE Left 08/11/2021   Procedure: OPEN REDUCTION INTERNAL FIXATION (ORIF) DISTAL RADIUS POSSIBLE PINNING DRUJ, POSSIBLE TRIANGULAR FIBER CARTILAGE COMPLEX REPAIR;  Surgeon: Leanora Cover, MD;  Location: [place];  Service: Orthopedics;  Laterality: Left;   orthognatic     jaw repair for overbite    There were no vitals filed for this visit.   Subjective Assessment - 11/30/21 0843     Subjective Pt reports he was "a little sore" after last visit but is overall feeling good    Patient Stated Goals get shoulder working again so he can return to normal function    Currently in Pain? No/denies                               [facility] Adult PT Treatment/Exercise - 11/30/21 0001       Shoulder Exercises: Supine   Protraction 20 reps    Protraction Weight (lbs) 5lbs    Other Supine Exercises rhythmic stablization 20sec x 3      Shoulder Exercises: Standing   External Rotation 20 reps    Theraband Level (Shoulder External Rotation) Level 3 (Green)    Internal Rotation 20 reps    Theraband Level (Shoulder Internal Rotation) Level 3 (Green)    Row 20 reps    Theraband Level  (Shoulder Row) Level 4 (Blue)    Row Limitations bow and arrow x 20 Lt blue TB    Shoulder Elevation Limitations serratus clock red TB x 10 bilat    Other Standing Exercises overhead press 7# bilat 8 x 2, box lift floor to waist x 5, waist to overhead x 5 (empty box)    Other Standing Exercises halo with 10# dumbell x 1 min      Shoulder Exercises: ROM/Strengthening   UBE (Upper Arm Bike) L6 x 6 min alt fwd/bkwd    Plank Limitations plank on bosu ball on mat table 2 x 1 min      Shoulder Exercises: Stretch   Other Shoulder Stretches doorway stretch bilat mid level and higher level x 20-30 sec x 3 reps                       PT Short Term Goals - 10/12/21 1526       PT SHORT TERM GOAL #1   Title Complete shoulder evaluation assessing ROM Lt shoulder as indicated    Time 2    Period Weeks    Status Achieved  Target Date 10/17/21      PT SHORT TERM GOAL #2   Title Instruct patient in initial HEP for Lt shoulder dysfunction following AC dislocation and Lt radius fx    Time 4    Period Weeks    Status Achieved    Target Date 11/01/21               PT Long Term Goals - 11/28/21 0902       PT LONG TERM GOAL #1   Title Improve posture and alignment with patient to demonstrate improved upright posture with posterior shoulder girdle musculature engaged    Time 6    Period Weeks    Status On-going    Target Date 01/09/22      PT LONG TERM GOAL #2   Title Increase AROM Lt shoulder to WNL's to allow functional use Lt UE    Time 6    Period Weeks    Status Revised    Target Date 01/09/22      PT LONG TERM GOAL #3   Title Increase strength Lt shoulder to 5/5 to prepare pt to return to work    Time 6    Period Weeks    Status Revised    Target Date 01/09/22      PT LONG TERM GOAL #4   Title Independent in HEP (including aquatic exercise as indicated)    Time 6    Period Weeks    Status On-going    Target Date 01/09/22      PT LONG TERM GOAL #5    Title Improve functional limitation score to 64    Time 6    Period Weeks    Status On-going    Target Date 01/09/22                   Plan - 11/30/21 0931     Clinical Impression Statement Pt continues to progress well with strength training. He requires cues for posture and to decrease compensations. He had difficulty with serratus wall clock and rhythmic stabilization and will benefit from continued training for these    PT Next Visit Plan work on serratus wall clock, rhythmic stabilization. progress strength and endurance    PT Home Exercise Plan [id number]    Consulted and Agree with Plan of Care Patient             Patient will benefit from skilled therapeutic intervention in order to improve the following deficits and impairments:     Visit Diagnosis: Acute pain of left shoulder  Other symptoms and signs involving the musculoskeletal system  Muscle weakness (generalized)  Abnormal posture     Problem List Patient Active Problem List   Diagnosis Date Noted   Left wrist fracture 08/11/2021    Ellysia Char, PT 11/30/2021, 9:34 AM  [facility] [place], [place], [address] Phone: [phone]   Fax:  [phone]  Name: Casey Wilson MRN: [id number] Date of Birth: January 22, 1976

## 2021-12-05 ENCOUNTER — Encounter: Payer: Self-pay | Admitting: Physical Therapy

## 2021-12-05 ENCOUNTER — Other Ambulatory Visit: Payer: Self-pay

## 2021-12-05 ENCOUNTER — Ambulatory Visit: Payer: Commercial Managed Care - PPO | Admitting: Physical Therapy

## 2021-12-05 DIAGNOSIS — R293 Abnormal posture: Secondary | ICD-10-CM

## 2021-12-05 DIAGNOSIS — M6281 Muscle weakness (generalized): Secondary | ICD-10-CM

## 2021-12-05 DIAGNOSIS — Z9889 Other specified postprocedural states: Secondary | ICD-10-CM | POA: Diagnosis not present

## 2021-12-05 DIAGNOSIS — R29898 Other symptoms and signs involving the musculoskeletal system: Secondary | ICD-10-CM

## 2021-12-05 DIAGNOSIS — M25512 Pain in left shoulder: Secondary | ICD-10-CM

## 2021-12-05 NOTE — Therapy (Signed)
[facility] [place] [address] [place] [address] [place], [place], [address] Phone: [phone]   Fax:  [phone]  Physical Therapy Treatment  Patient Details  Name: Casey Wilson MRN: [id number] Date of Birth: [birth year] Referring Provider (PT): Dr Ramond Marrow   Encounter Date: 12/05/2021   PT End of Session - 12/05/21 0848     Visit Number 17    Number of Visits 24    Date for PT Re-Evaluation 01/09/22    PT Start Time 0843    PT Stop Time 0939   Ice last 10 min   PT Time Calculation (min) 56 min             History reviewed. No pertinent past medical history.  Past Surgical History:  Procedure Laterality Date   ACHILLES TENDON REPAIR     Both feet   ORIF WRIST FRACTURE Left 08/11/2021   Procedure: OPEN REDUCTION INTERNAL FIXATION (ORIF) DISTAL RADIUS POSSIBLE PINNING DRUJ, POSSIBLE TRIANGULAR FIBER CARTILAGE COMPLEX REPAIR;  Surgeon: Betha Loa, MD;  Location: [facility];  Service: Orthopedics;  Laterality: Left;   orthognatic     jaw repair for overbite    There were no vitals filed for this visit.   Subjective Assessment - 12/05/21 0849     Subjective Pt reports he is sore and tired.  He was lifting boxes and plywood for renovation project, with help.    Patient Stated Goals get shoulder working again so he can return to normal function    Currently in Pain? Yes    Pain Score 2     Pain Location Shoulder    Pain Orientation Left;Anterior    Pain Descriptors / Indicators Tightness;Sore    Aggravating Factors  ?    Pain Relieving Factors ?                [facility] PT Assessment - 12/05/21 0001       Assessment   Medical Diagnosis Lt shoulder AC repair    Referring Provider (PT) Dr Ramond Marrow    Onset Date/Surgical Date 08/18/21   injury 08/11/21   Hand Dominance Right    Next MD Visit 12/16/21             [facility] Adult PT Treatment/Exercise - 12/05/21 0001       Shoulder Exercises: Sidelying   Other Sidelying  Exercises open book Lt thoracic rotation x 4 reps straight, 3 reps in rainbow, 2 reps with hand behind head, 5-10 sec hold, then 5 reps with green band      Shoulder Exercises: Standing   External Rotation Strengthening;Left;12 reps    Theraband Level (Shoulder External Rotation) Level 3 (Green)    Internal Rotation Strengthening;Left;20 reps    Theraband Level (Shoulder Internal Rotation) Level 3 (Green)    Row Strengthening;Left;15 reps    Theraband Level (Shoulder Row) Level 4 (Blue)    Shoulder Elevation Limitations serratus clock red TB x 12 bilat    Other Standing Exercises overhead press with 10# x 8 reps;  Lt 10# KB lift to chest level shelf x 5 reps, 2 sets, cues to avoid compensation.    Other Standing Exercises halo with 3# dumbell x 1 min      Shoulder Exercises: ROM/Strengthening   UBE (Upper Arm Bike) L6 x 5.5 min alt fwd/bkwd    Wall Pushups Limitations Lt wrist ext stretch in pushup position x 20 sec x 2 reps    Plank Limitations plank on bosu ball on  mat table 3 reps of 10-15 sec with cues to      Shoulder Exercises: Stretch   Wall Stretch - Flexion 3 reps;10 seconds    Wall Stretch - Flexion Limitations hands over doorway    Other Shoulder Stretches unilateral midlevel doorway stretch x 20 sec x 3 reps    Other Shoulder Stretches Lt shoulder ext stretch holding door frame x 2,  sec      Cryotherapy   Number Minutes Cryotherapy 10 Minutes    Cryotherapy Location Wrist;Shoulder    Type of Cryotherapy Ice pack                       PT Short Term Goals - 10/12/21 1526       PT SHORT TERM GOAL #1   Title Complete shoulder evaluation assessing ROM Lt shoulder as indicated    Time 2    Period Weeks    Status Achieved    Target Date 10/17/21      PT SHORT TERM GOAL #2   Title Instruct patient in initial HEP for Lt shoulder dysfunction following AC dislocation and Lt radius fx    Time 4    Period Weeks    Status Achieved    Target Date 11/01/21                PT Long Term Goals - 11/28/21 0902       PT LONG TERM GOAL #1   Title Improve posture and alignment with patient to demonstrate improved upright posture with posterior shoulder girdle musculature engaged    Time 6    Period Weeks    Status On-going    Target Date 01/09/22      PT LONG TERM GOAL #2   Title Increase AROM Lt shoulder to WNL's to allow functional use Lt UE    Time 6    Period Weeks    Status Revised    Target Date 01/09/22      PT LONG TERM GOAL #3   Title Increase strength Lt shoulder to 5/5 to prepare pt to return to work    Time 6    Period Weeks    Status Revised    Target Date 01/09/22      PT LONG TERM GOAL #4   Title Independent in HEP (including aquatic exercise as indicated)    Time 6    Period Weeks    Status On-going    Target Date 01/09/22      PT LONG TERM GOAL #5   Title Improve functional limitation score to 64    Time 6    Period Weeks    Status On-going    Target Date 01/09/22                   Plan - 12/05/21 1637     Clinical Impression Statement Pt arrived with more soreness than previous sessions, which limited his tolerance for strengthening and endurance exercises.  He fatigued quickly with overhead exercises.  He is limited in Lt shoulder ER and Lt wrist ext ROM.  Pt is progressing gradually towards remaining LTGs.    Rehab Potential Good    PT Frequency 2x / week    PT Duration 6 weeks    PT Treatment/Interventions ADLs/Self Care Home Management;Aquatic Therapy;Cryotherapy;Electrical Stimulation;Iontophoresis 4mg /ml Dexamethasone;Moist Heat;Ultrasound;Functional mobility training;Therapeutic activities;Therapeutic exercise;Neuromuscular re-education;Patient/family education;Manual techniques;Scar mobilization;Passive range of motion;Dry needling;Vasopneumatic Device    PT Next Visit Plan Continue  progressive strengthening and Lt shoulder ROM to assist with return to work.    PT Home Exercise Plan  [id number]    Consulted and Agree with Plan of Care Patient             Patient will benefit from skilled therapeutic intervention in order to improve the following deficits and impairments:  Decreased range of motion, Increased fascial restricitons, Impaired UE functional use, Decreased activity tolerance, Pain, Impaired flexibility, Improper body mechanics, Decreased mobility, Decreased strength, Increased edema, Postural dysfunction  Visit Diagnosis: Acute pain of left shoulder  Other symptoms and signs involving the musculoskeletal system  Muscle weakness (generalized)  Abnormal posture     Problem List Patient Active Problem List   Diagnosis Date Noted   Left wrist fracture 08/11/2021   Mayer CamelJennifer Carlson-Long, PTA 12/05/21 5:17 PM   [facility] [address] [place] [address] [place], [place], [address] Phone: [phone]   Fax:  [phone]  Name: Blima RichMichael Bickle MRN: [id number] Date of Birth: 1976-02-09

## 2021-12-07 ENCOUNTER — Other Ambulatory Visit: Payer: Self-pay

## 2021-12-07 ENCOUNTER — Ambulatory Visit: Payer: Commercial Managed Care - PPO | Attending: Orthopaedic Surgery | Admitting: Physical Therapy

## 2021-12-07 DIAGNOSIS — R29898 Other symptoms and signs involving the musculoskeletal system: Secondary | ICD-10-CM | POA: Insufficient documentation

## 2021-12-07 DIAGNOSIS — M6281 Muscle weakness (generalized): Secondary | ICD-10-CM | POA: Diagnosis present

## 2021-12-07 DIAGNOSIS — M25512 Pain in left shoulder: Secondary | ICD-10-CM | POA: Insufficient documentation

## 2021-12-07 DIAGNOSIS — R293 Abnormal posture: Secondary | ICD-10-CM | POA: Insufficient documentation

## 2021-12-07 NOTE — Therapy (Signed)
[facility] [place] [address] [place] [address] [place], [place], [address] Phone: [phone]   Fax:  [phone]  Physical Therapy Treatment  Patient Details  Name: Casey Wilson MRN: [id number] Date of Birth: 12-22-1975 Referring Provider (PT): Dr Ramond Marrow   Encounter Date: 12/07/2021   PT End of Session - 12/07/21 0921     Visit Number 18    Number of Visits 24    Date for PT Re-Evaluation 01/09/22    PT Start Time 0835    PT Stop Time 0920    PT Time Calculation (min) 45 min    Activity Tolerance Patient tolerated treatment well    Behavior During Therapy [facility] for tasks assessed/performed             No past medical history on file.  Past Surgical History:  Procedure Laterality Date   ACHILLES TENDON REPAIR     Both feet   ORIF WRIST FRACTURE Left 08/11/2021   Procedure: OPEN REDUCTION INTERNAL FIXATION (ORIF) DISTAL RADIUS POSSIBLE PINNING DRUJ, POSSIBLE TRIANGULAR FIBER CARTILAGE COMPLEX REPAIR;  Surgeon: Betha Loa, MD;  Location: [facility];  Service: Orthopedics;  Laterality: Left;   orthognatic     jaw repair for overbite    There were no vitals filed for this visit.   Subjective Assessment - 12/07/21 0842     Subjective Pt states he is still sore from home renovation project. He returns to MD 2/10    Patient Stated Goals get shoulder working again so he can return to normal function    Currently in Pain? Yes    Pain Score 2     Pain Location Shoulder    Pain Orientation Left                               [facility] Adult PT Treatment/Exercise - 12/07/21 0001       Shoulder Exercises: Supine   Protraction 20 reps    Protraction Weight (lbs) 5lbs    Other Supine Exercises rhythmic stabilzation 30 sec x 3      Shoulder Exercises: Standing   External Rotation 20 reps    Theraband Level (Shoulder External Rotation) Level 4 (Blue)    Internal Rotation 20 reps    Theraband Level (Shoulder Internal  Rotation) Level 4 (Blue)    Row --   30 reps   Theraband Level (Shoulder Row) Level 4 (Blue)    Row Limitations bow and arrow blue TB x 20 bilat    Shoulder Elevation Limitations serratus clock red TB x 12 bilat    Other Standing Exercises overhead press 10# x 8    Other Standing Exercises halo 7# dumbell x 1 min      Shoulder Exercises: ROM/Strengthening   UBE (Upper Arm Bike) L6 x 6 min alt fwd/bkwd    Plank Limitations plank on BOSU 10-15 sec x 1 limited due to wrist pain    Other ROM/Strengthening Exercises rebounder with red med ball focus on stability with overhead toss and catch    Other ROM/Strengthening Exercises 90/90 carry with 5# KB x 2 laps      Shoulder Exercises: Stretch   Wall Stretch - Flexion 3 reps;10 seconds    Wall Stretch - Flexion Limitations hands over doorway    Other Shoulder Stretches unilateral middle doorway stretch    Other Shoulder Stretches Lt shoulder ext stretch holding door frame x 2, 20 sec  PT Short Term Goals - 10/12/21 1526       PT SHORT TERM GOAL #1   Title Complete shoulder evaluation assessing ROM Lt shoulder as indicated    Time 2    Period Weeks    Status Achieved    Target Date 10/17/21      PT SHORT TERM GOAL #2   Title Instruct patient in initial HEP for Lt shoulder dysfunction following AC dislocation and Lt radius fx    Time 4    Period Weeks    Status Achieved    Target Date 11/01/21               PT Long Term Goals - 11/28/21 0902       PT LONG TERM GOAL #1   Title Improve posture and alignment with patient to demonstrate improved upright posture with posterior shoulder girdle musculature engaged    Time 6    Period Weeks    Status On-going    Target Date 01/09/22      PT LONG TERM GOAL #2   Title Increase AROM Lt shoulder to WNL's to allow functional use Lt UE    Time 6    Period Weeks    Status Revised    Target Date 01/09/22      PT LONG TERM GOAL #3   Title  Increase strength Lt shoulder to 5/5 to prepare pt to return to work    Time 6    Period Weeks    Status Revised    Target Date 01/09/22      PT LONG TERM GOAL #4   Title Independent in HEP (including aquatic exercise as indicated)    Time 6    Period Weeks    Status On-going    Target Date 01/09/22      PT LONG TERM GOAL #5   Title Improve functional limitation score to 64    Time 6    Period Weeks    Status On-going    Target Date 01/09/22                   Plan - 12/07/21 3976     Clinical Impression Statement Pt with good tolerance to shoulder exercises this session. Pain in wrist with plank on BOSU. Pt continues with most difficulty with rhythmic stabilization and shoulder coordination activities    PT Next Visit Plan Lt shoulder strength to return to work. Lt shoulder coordination/NMR    PT Home Exercise Plan [id number]    Consulted and Agree with Plan of Care Patient             Patient will benefit from skilled therapeutic intervention in order to improve the following deficits and impairments:     Visit Diagnosis: Acute pain of left shoulder  Other symptoms and signs involving the musculoskeletal system  Muscle weakness (generalized)  Abnormal posture     Problem List Patient Active Problem List   Diagnosis Date Noted   Left wrist fracture 08/11/2021    Fynn Vanblarcom, PT 12/07/2021, 9:23 AM  [facility] [address] [place] [address] [place], [place], [address] Phone: [phone]   Fax:  [phone]  Name: Bralyn Folkert MRN: [id number] Date of Birth: Oct 06, 1976

## 2021-12-12 ENCOUNTER — Other Ambulatory Visit: Payer: Self-pay

## 2021-12-12 ENCOUNTER — Encounter: Payer: Self-pay | Admitting: Rehabilitative and Restorative Service Providers"

## 2021-12-12 ENCOUNTER — Ambulatory Visit: Payer: Commercial Managed Care - PPO | Admitting: Rehabilitative and Restorative Service Providers"

## 2021-12-12 DIAGNOSIS — M6281 Muscle weakness (generalized): Secondary | ICD-10-CM

## 2021-12-12 DIAGNOSIS — M25512 Pain in left shoulder: Secondary | ICD-10-CM | POA: Diagnosis not present

## 2021-12-12 DIAGNOSIS — R29898 Other symptoms and signs involving the musculoskeletal system: Secondary | ICD-10-CM

## 2021-12-12 DIAGNOSIS — R293 Abnormal posture: Secondary | ICD-10-CM

## 2021-12-12 NOTE — Therapy (Signed)
[facility] [place] [address] [place], [place], [address] Phone: [phone]   Fax:  [phone]  Physical Therapy Treatment  Patient Details  Name: Casey Wilson MRN: [id number] Date of Birth: 1976-09-08 Referring Provider (PT): Dr Ramond Marrow   Encounter Date: 12/12/2021   PT End of Session - 12/12/21 0836     Visit Number 19    Number of Visits 24    Date for PT Re-Evaluation 01/09/22    PT Start Time 0837    PT Stop Time 0926    PT Time Calculation (min) 49 min    Activity Tolerance Patient tolerated treatment well             History reviewed. No pertinent past medical history.  Past Surgical History:  Procedure Laterality Date   ACHILLES TENDON REPAIR     Both feet   ORIF WRIST FRACTURE Left 08/11/2021   Procedure: OPEN REDUCTION INTERNAL FIXATION (ORIF) DISTAL RADIUS POSSIBLE PINNING DRUJ, POSSIBLE TRIANGULAR FIBER CARTILAGE COMPLEX REPAIR;  Surgeon: Betha Loa, MD;  Location: [facility] OR;  Service: Orthopedics;  Laterality: Left;   orthognatic     jaw repair for overbite    There were no vitals filed for this visit.   Subjective Assessment - 12/12/21 0837     Subjective Returns to Dr [name] 12/16/21 and thinks he will be released to RTW probably without restrictions. He is doing better with shoulder and wrist.    Currently in Pain? No/denies    Pain Score 0-No pain    Pain Location Shoulder    Pain Orientation Left    Pain Descriptors / Indicators Tightness;Sore    Pain Type Acute pain    Pain Onset More than a month ago    Pain Frequency Intermittent    Aggravating Factors  overuse Lt UE    Pain Relieving Factors rest                [facility] PT Assessment - 12/12/21 0001       Assessment   Medical Diagnosis Lt shoulder AC repair    Referring Provider (PT) Dr Ramond Marrow    Onset Date/Surgical Date 08/18/21   injury 08/11/21   Hand Dominance Right    Next MD Visit 12/16/21    Prior Therapy  12/16/21      Observation/Other Assessments   Focus on Therapeutic Outcomes (FOTO)  69      Posture/Postural Control   Posture Comments improving posture ad alignment - tends to stand and sit with head forward shoulders rounded      AROM   Right Shoulder Extension 53 Degrees    Right Shoulder Flexion 152 Degrees    Right Shoulder ABduction 150 Degrees    Right Shoulder Internal Rotation --   thumb T8   Right Shoulder External Rotation 55 Degrees   Rt UE at side   Left Shoulder Extension 57 Degrees    Left Shoulder Flexion 150 Degrees    Left Shoulder ABduction 145 Degrees    Left Shoulder Internal Rotation --   thumb T 11   Left Shoulder External Rotation 50 Degrees   LT UE at side     Strength   Left Shoulder Flexion 5/5    Left Shoulder Extension 5/5    Left Shoulder ABduction 4+/5    Left Shoulder Internal Rotation 4+/5    Left Shoulder External Rotation 4+/5      Palpation   Palpation comment tightness to palpation through the  anterior Lt shoulder pecs/clavicular area                           [facility] Adult PT Treatment/Exercise - 12/12/21 0001       Shoulder Exercises: Standing   Horizontal ABduction Limitations ball at dorsum of hand to work on ER shoulder/elbow 90/90 small circles CW/CCW x 30 x 2 sets    External Rotation 20 reps    Theraband Level (Shoulder External Rotation) Level 4 (Blue)    External Rotation Limitations isometric hold x 30 sec x 5 reps    Internal Rotation 20 reps    Theraband Level (Shoulder Internal Rotation) Level 4 (Blue)    Row Strengthening;Left;15 reps   isometric hold black TB 30 sec x 5 reps   Theraband Level (Shoulder Row) Level 4 (Blue)    Row Limitations bow and arrow blue TB x 20 bilat    Shoulder Elevation Limitations serratus clock red TB x 12 bilat    Other Standing Exercises overhead press 5# KB x 10 x 2 sets      Shoulder Exercises: ROM/Strengthening   UBE (Upper Arm Bike) L8 x 6 min alt fwd/bkwd    Plank  Limitations plank on counter with pillow 60 sec x 2 limited on BOSU due to wrist pain    Other ROM/Strengthening Exercises rebounder with red med ball focus on stability with overhead toss and catch    Other ROM/Strengthening Exercises 90/90 carry with 5# KB x 2 laps                     PT Education - 12/12/21 0925     Education Details HEP    Person(s) Educated Patient    Methods Explanation;Demonstration;Tactile cues;Verbal cues;Handout    Comprehension Verbalized understanding;Returned demonstration;Verbal cues required;Tactile cues required              PT Short Term Goals - 12/12/21 0849       PT SHORT TERM GOAL #2   Title Instruct patient in initial HEP for Lt shoulder dysfunction following AC dislocation and Lt radius fx               PT Long Term Goals - 12/12/21 0849       PT LONG TERM GOAL #1   Title Improve posture and alignment with patient to demonstrate improved upright posture with posterior shoulder girdle musculature engaged    Time 6    Period Weeks    Status On-going    Target Date 01/09/22      PT LONG TERM GOAL #2   Title Increase AROM Lt shoulder to WNL's to allow functional use Lt UE    Time 6    Period Weeks    Status On-going    Target Date 01/09/22      PT LONG TERM GOAL #3   Title Increase strength Lt shoulder to 5/5 to prepare pt to return to work    Time 6    Period Weeks    Status On-going    Target Date 01/09/22      PT LONG TERM GOAL #4   Title Independent in HEP (including aquatic exercise as indicated)    Time 6    Period Weeks    Status On-going    Target Date 01/09/22      PT LONG TERM GOAL #5   Title Improve functional limitation score to 64    Time  6    Period Weeks    Status On-going    Target Date 01/09/22                   Plan - 12/12/21 0842     Clinical Impression Statement Progressing well with shoulder rehab. REsistive exercises are limited by decreased AROM and wt bearing  tolerance Lt wrist. Good gains in AROM and strength Lt shoulder noted. Patient is progressing well toward stated goals of therapy. Would benefit from continued treatment to progress strengthening and stabilization activities Lt UE.    Rehab Potential Good    PT Frequency 2x / week    PT Duration 6 weeks    PT Treatment/Interventions ADLs/Self Care Home Management;Aquatic Therapy;Cryotherapy;Electrical Stimulation;Iontophoresis 4mg /ml Dexamethasone;Moist Heat;Ultrasound;Functional mobility training;Therapeutic activities;Therapeutic exercise;Neuromuscular re-education;Patient/family education;Manual techniques;Scar mobilization;Passive range of motion;Dry needling;Vasopneumatic Device    PT Next Visit Plan Lt shoulder strength to return to work. Lt shoulder coordination/NMR    PT Home Exercise Plan [id number]    Consulted and Agree with Plan of Care Patient             Patient will benefit from skilled therapeutic intervention in order to improve the following deficits and impairments:     Visit Diagnosis: Acute pain of left shoulder  Other symptoms and signs involving the musculoskeletal system  Muscle weakness (generalized)  Abnormal posture     Problem List Patient Active Problem List   Diagnosis Date Noted   Left wrist fracture 08/11/2021    Ricardo Kayes Rober Minion, PT. MPH  12/12/2021, 9:29 AM  [facility] [address] [place] [address] [place], [place], [address] Phone: [phone]   Fax:  [phone]  Name: Deakin Affeldt MRN: [id number] Date of Birth: 1976/05/12

## 2021-12-12 NOTE — Patient Instructions (Signed)
Access Code: [email or web address] URL: [email or web address] Date: 12/12/2021 Prepared by: Gillermo Murdoch  Exercises Seated Cervical Sidebending AROM - 2 x daily - 7 x weekly - 1 sets - 5 reps - 5-10 sec hold Supine Shoulder External Rotation in 45 Degrees Abduction AAROM with Dowel - 2 x daily - 7 x weekly - 1 sets - 10 reps - 5-10 seconds hold Standing Shoulder Internal Rotation Stretch with Hands Behind Back - 2 x daily - 7 x weekly - 1 sets - 3 reps - 20 seconds hold Standing Shoulder Flexion Stretch on Wall - 1 x daily - 7 x weekly - 1 sets - 10 reps - 3-5 second hold Standing Shoulder Abduction AAROM with Dowel - 1 x daily - 7 x weekly - 1-3 sets - 10 reps - 1-2second hold Standing Shoulder Extension with Dowel - 1 x daily - 7 x weekly - 1 sets - 10 reps Shoulder External Rotation with Anchored Resistance - 1 x daily - 7 x weekly - 2 sets - 10 reps Shoulder Internal Rotation with Resistance - 1 x daily - 7 x weekly - 2 sets - 10 reps Standing Bilateral Low Shoulder Row with Anchored Resistance - 1 x daily - 7 x weekly - 2 sets - 10 reps Doorway Pec Stretch at 60 Degrees Abduction - 3 x daily - 7 x weekly - 1 sets - 3 reps Doorway Pec Stretch at 90 Degrees Abduction - 3 x daily - 7 x weekly - 1 sets - 3 reps - 30 seconds hold Doorway Pec Stretch at 120 Degrees Abduction - 3 x daily - 7 x weekly - 1 sets - 3 reps - 30 second hold hold Standing Shoulder Flexion Wall Walk - 2 x daily - 7 x weekly - 1 sets - 3 reps - 30 sec hold Seated Lat Pull Down with Resistance - Elbows Bent - 1 x daily - 7 x weekly - 2-3 sets - 10 reps - 3 sec hold Drawing Bow - 1 x daily - 7 x weekly - 2-3 sets - 10 reps - 3 sec hold Isometric Shoulder External Rotation in Abduction with Ball at Wall - 1 x daily - 7 x weekly - 1-2 min hold Shoulder External Rotation Reactive Isometrics - 2 x daily - 7 x weekly - 1 sets - 5-10 reps - 30 sec hold

## 2021-12-15 ENCOUNTER — Ambulatory Visit: Payer: Commercial Managed Care - PPO | Admitting: Physical Therapy

## 2021-12-15 ENCOUNTER — Other Ambulatory Visit: Payer: Self-pay

## 2021-12-15 DIAGNOSIS — R293 Abnormal posture: Secondary | ICD-10-CM

## 2021-12-15 DIAGNOSIS — M6281 Muscle weakness (generalized): Secondary | ICD-10-CM

## 2021-12-15 DIAGNOSIS — M25512 Pain in left shoulder: Secondary | ICD-10-CM

## 2021-12-15 DIAGNOSIS — R29898 Other symptoms and signs involving the musculoskeletal system: Secondary | ICD-10-CM

## 2021-12-15 NOTE — Therapy (Signed)
[facility] [place] [address] [place] [address] [place], [place], [address] Phone: [phone]   Fax:  [phone]  Physical Therapy Treatment  Patient Details  Name: Casey Wilson MRN: [id number] Date of Birth: 1976/07/15 Referring Provider (PT): Dr Ramond Marrow   Encounter Date: 12/15/2021   PT End of Session - 12/15/21 1019     Visit Number 20    Number of Visits 24    Date for PT Re-Evaluation 01/09/22    PT Start Time 1017    PT Stop Time 1100    PT Time Calculation (min) 43 min    Activity Tolerance Patient tolerated treatment well    Behavior During Therapy [facility] for tasks assessed/performed             No past medical history on file.  Past Surgical History:  Procedure Laterality Date   ACHILLES TENDON REPAIR     Both feet   ORIF WRIST FRACTURE Left 08/11/2021   Procedure: OPEN REDUCTION INTERNAL FIXATION (ORIF) DISTAL RADIUS POSSIBLE PINNING DRUJ, POSSIBLE TRIANGULAR FIBER CARTILAGE COMPLEX REPAIR;  Surgeon: Betha Loa, MD;  Location: [facility] OR;  Service: Orthopedics;  Laterality: Left;   orthognatic     jaw repair for overbite    There were no vitals filed for this visit.   Subjective Assessment - 12/15/21 1020     Subjective "Shoulder is doing good."    Pertinent History jaw surgery moving lower jaw fwd 7 mm; bilat achiles's tendon surgery Lt 2014; Rt 2017    Patient Stated Goals get shoulder working again so he can return to normal function    Currently in Pain? No/denies    Pain Onset More than a month ago                               [facility] Adult PT Treatment/Exercise - 12/15/21 0001       Shoulder Exercises: Seated   External Rotation Strengthening;Left;10 reps   3 sets   External Rotation Weight (lbs) 7 lbs    External Rotation Limitations In shoulder abduction resting on table    Internal Rotation Strengthening;Left;10 reps   3 sets   Theraband Level (Shoulder Internal Rotation) Level 4  (Blue)    Internal Rotation Limitations In shoulder abduction resting on table    Flexion Strengthening;Left;10 reps;Weights   2 sets   Flexion Limitations scaption    Other Seated Exercises Mid deltoid 3x10 5#; anterior deltoid 3x10 5#   table support   Other Seated Exercises Ant to mid deltoid in/out with overhead press 2x1 min with 3 lbs      Shoulder Exercises: Prone   Other Prone Exercises forearm plank on bosu ball 2x30 sec; with alternating shoulder touch x30 sec    Other Prone Exercises forearm plank with head behind head rotation x10      Shoulder Exercises: Standing   Other Standing Exercises Overhead carry x2 laps 15#    Other Standing Exercises Farmer's carry 15# x1 lap                       PT Short Term Goals - 12/12/21 0849       PT SHORT TERM GOAL #2   Title Instruct patient in initial HEP for Lt shoulder dysfunction following AC dislocation and Lt radius fx               PT Long Term Goals -  12/12/21 0849       PT LONG TERM GOAL #1   Title Improve posture and alignment with patient to demonstrate improved upright posture with posterior shoulder girdle musculature engaged    Time 6    Period Weeks    Status On-going    Target Date 01/09/22      PT LONG TERM GOAL #2   Title Increase AROM Lt shoulder to WNL's to allow functional use Lt UE    Time 6    Period Weeks    Status On-going    Target Date 01/09/22      PT LONG TERM GOAL #3   Title Increase strength Lt shoulder to 5/5 to prepare pt to return to work    Time 6    Period Weeks    Status On-going    Target Date 01/09/22      PT LONG TERM GOAL #4   Title Independent in HEP (including aquatic exercise as indicated)    Time 6    Period Weeks    Status On-going    Target Date 01/09/22      PT LONG TERM GOAL #5   Title Improve functional limitation score to 64    Time 6    Period Weeks    Status On-going    Target Date 01/09/22                   Plan - 12/15/21  1047     Clinical Impression Statement Treatment focused on continuing to increase shoulder stability and strength. Focused primarily on deltoid strengthening this session. Pt only able to tolerate 5 lbs.    Stability/Clinical Decision Making Stable/Uncomplicated    Rehab Potential Good    PT Frequency 2x / week    PT Duration 6 weeks    PT Treatment/Interventions ADLs/Self Care Home Management;Aquatic Therapy;Cryotherapy;Electrical Stimulation;Iontophoresis 4mg /ml Dexamethasone;Moist Heat;Ultrasound;Functional mobility training;Therapeutic activities;Therapeutic exercise;Neuromuscular re-education;Patient/family education;Manual techniques;Scar mobilization;Passive range of motion;Dry needling;Vasopneumatic Device    PT Next Visit Plan Lt shoulder strength to return to work. Lt shoulder coordination/NMR    PT Home Exercise Plan [id number]    Consulted and Agree with Plan of Care Patient             Patient will benefit from skilled therapeutic intervention in order to improve the following deficits and impairments:  Decreased range of motion, Increased fascial restricitons, Impaired UE functional use, Decreased activity tolerance, Pain, Impaired flexibility, Improper body mechanics, Decreased mobility, Decreased strength, Increased edema, Postural dysfunction  Visit Diagnosis: Acute pain of left shoulder  Other symptoms and signs involving the musculoskeletal system  Muscle weakness (generalized)  Abnormal posture     Problem List Patient Active Problem List   Diagnosis Date Noted   Left wrist fracture 08/11/2021    [facility] April May, PT, DPT 12/15/2021, 11:03 AM  [facility] [address] [place] [address] [place], [place], [place] Phone: [phone]   Fax:  [phone]  Name: Casey Wilson MRN: Blima Rich Date of Birth: [birth year]

## 2021-12-23 ENCOUNTER — Ambulatory Visit: Payer: Commercial Managed Care - PPO | Admitting: Physical Therapy

## 2021-12-30 ENCOUNTER — Ambulatory Visit: Payer: Commercial Managed Care - PPO | Admitting: Rehabilitative and Restorative Service Providers"

## 2021-12-30 ENCOUNTER — Encounter: Payer: Self-pay | Admitting: Rehabilitative and Restorative Service Providers"

## 2021-12-30 ENCOUNTER — Other Ambulatory Visit: Payer: Self-pay

## 2021-12-30 DIAGNOSIS — R293 Abnormal posture: Secondary | ICD-10-CM

## 2021-12-30 DIAGNOSIS — R29898 Other symptoms and signs involving the musculoskeletal system: Secondary | ICD-10-CM

## 2021-12-30 DIAGNOSIS — M25512 Pain in left shoulder: Secondary | ICD-10-CM

## 2021-12-30 DIAGNOSIS — M6281 Muscle weakness (generalized): Secondary | ICD-10-CM

## 2021-12-30 NOTE — Therapy (Signed)
[facility] [place] [address] [place] [address] [place], [place], [address] Phone: [phone]   Fax:  [phone]  Physical Therapy Treatment  Patient Details  Name: Casey Wilson MRN: [id number] Date of Birth: 1976-04-18 Referring Provider (PT): Dr Ramond Marrow   Encounter Date: 12/30/2021   PT End of Session - 12/30/21 0928     Visit Number 21    Number of Visits 24    Date for PT Re-Evaluation 01/09/22    PT Start Time 0928    PT Stop Time 1019    PT Time Calculation (min) 51 min    Activity Tolerance Patient tolerated treatment well             History reviewed. No pertinent past medical history.  Past Surgical History:  Procedure Laterality Date   ACHILLES TENDON REPAIR     Both feet   ORIF WRIST FRACTURE Left 08/11/2021   Procedure: OPEN REDUCTION INTERNAL FIXATION (ORIF) DISTAL RADIUS POSSIBLE PINNING DRUJ, POSSIBLE TRIANGULAR FIBER CARTILAGE COMPLEX REPAIR;  Surgeon: Betha Loa, MD;  Location: [facility] OR;  Service: Orthopedics;  Laterality: Left;   orthognatic     jaw repair for overbite    There were no vitals filed for this visit.   Subjective Assessment - 12/30/21 0929     Subjective Shoulder has been stiff and sore. Back at work and is not doing much physically at work doing more work on the computer.    Currently in Pain? Yes    Pain Score 6     Pain Location Shoulder    Pain Orientation Left    Pain Descriptors / Indicators Sore;Tightness    Pain Type Acute pain                [facility] PT Assessment - 12/30/21 0001       Assessment   Medical Diagnosis Lt shoulder AC repair    Referring Provider (PT) Dr Ramond Marrow    Onset Date/Surgical Date 08/18/21   injury 08/11/21   Hand Dominance Right    Next MD Visit released from care      AROM   Right Shoulder Extension 53 Degrees    Right Shoulder Flexion 152 Degrees    Right Shoulder ABduction 150 Degrees    Right Shoulder Internal Rotation --   thumb T8    Right Shoulder External Rotation 55 Degrees   Rt UE at side   Left Shoulder Extension 50 Degrees    Left Shoulder Flexion 95 Degrees   painful   Left Shoulder ABduction 97 Degrees   painful   Left Shoulder Internal Rotation --   thumb lateral waist line - painful   Left Shoulder External Rotation 47 Degrees   LT UE at side     Strength   Overall Strength Comments pain with resistive testing Lt shoulder flexion and abduction      Palpation   Palpation comment tightness to palpation through the anterior Lt shoulder pecs/clavicular area                           [facility] Adult PT Treatment/Exercise - 12/30/21 0001       Shoulder Exercises: Supine   Other Supine Exercises scap squeeze 5 sec x 5 reps      Shoulder Exercises: ROM/Strengthening   UBE (Upper Arm Bike) L8 x 2 min alt fwd/bkwd      Shoulder Exercises: Stretch   Table Stretch - ABduction Limitations  supine horizontal abduction stretch arms at side T 1 min x 3 reps      Moist Heat Therapy   Number Minutes Moist Heat 15 Minutes    Moist Heat Location Shoulder      Electrical Stimulation   Electrical Stimulation Location Lt anterior shoudler/pecs/clavicle    Electrical Stimulation Action TENS    Electrical Stimulation Parameters to tolerance    Electrical Stimulation Goals Pain;Tone      Manual Therapy   Manual therapy comments skilled palpation to assess response to DN and manual work    Soft tissue mobilization deep tissue work through the pecs; clavicular area; biceps; triceps              Trigger Point Dry Needling - 12/30/21 0001     Consent Given? Yes    Education Handout Provided Previously provided    Pectoralis Major Response Palpable increased muscle length;Twitch response elicited    Pectoralis Minor Response Palpable increased muscle length;Twitch response elicited                   PT Education - 12/30/21 0954     Education Details ergonomics    Person(s) Educated  Patient    Methods Explanation;Handout    Comprehension Verbalized understanding              PT Short Term Goals - 12/12/21 0849       PT SHORT TERM GOAL #2   Title Instruct patient in initial HEP for Lt shoulder dysfunction following AC dislocation and Lt radius fx               PT Long Term Goals - 12/12/21 0849       PT LONG TERM GOAL #1   Title Improve posture and alignment with patient to demonstrate improved upright posture with posterior shoulder girdle musculature engaged    Time 6    Period Weeks    Status On-going    Target Date 01/09/22      PT LONG TERM GOAL #2   Title Increase AROM Lt shoulder to WNL's to allow functional use Lt UE    Time 6    Period Weeks    Status On-going    Target Date 01/09/22      PT LONG TERM GOAL #3   Title Increase strength Lt shoulder to 5/5 to prepare pt to return to work    Time 6    Period Weeks    Status On-going    Target Date 01/09/22      PT LONG TERM GOAL #4   Title Independent in HEP (including aquatic exercise as indicated)    Time 6    Period Weeks    Status On-going    Target Date 01/09/22      PT LONG TERM GOAL #5   Title Improve functional limitation score to 64    Time 6    Period Weeks    Status On-going    Target Date 01/09/22                   Plan - 12/30/21 0949     Clinical Impression Statement Patient returns with significant flare up of symptoms in the Lt shoulder area with pain with functional and work activities as well as at rest. He has had increased pain in the past 3-4 days possibly related to work on his floor at home and getting up out of the floor pressing down through the  Lt UE. He has limited and painful AROM Lt shoulder; pain with resistive testing; pain with palpation through the Lt pecs and clavicular area. Tolerated treatment well with some release of muscular tightness noted following treatment.    Rehab Potential Good    PT Frequency 2x / week    PT Duration 6  weeks    PT Treatment/Interventions ADLs/Self Care Home Management;Aquatic Therapy;Cryotherapy;Electrical Stimulation;Iontophoresis 4mg /ml Dexamethasone;Moist Heat;Ultrasound;Functional mobility training;Therapeutic activities;Therapeutic exercise;Neuromuscular re-education;Patient/family education;Manual techniques;Scar mobilization;Passive range of motion;Dry needling;Vasopneumatic Device    PT Next Visit Plan addess flare up of symptoms as indicted with DN, manual work, modalities; return to Lt shoulder strength to return to work. Lt shoulder coordination/NMR    PT Home Exercise Plan [id number] exercises; ergonomics    Consulted and Agree with Plan of Care Patient             Patient will benefit from skilled therapeutic intervention in order to improve the following deficits and impairments:     Visit Diagnosis: Acute pain of left shoulder  Other symptoms and signs involving the musculoskeletal system  Muscle weakness (generalized)  Abnormal posture     Problem List Patient Active Problem List   Diagnosis Date Noted   Left wrist fracture 08/11/2021    Askia Hazelip 10/11/2021, PT, MPH  12/30/2021, 10:11 AM  [facility] [address] [place] [address] [place], [place], [place] Phone: [phone]   Fax:  [phone]  Name: Casey Wilson MRN: Blima Rich Date of Birth: [birth year]

## 2021-12-30 NOTE — Patient Instructions (Signed)
Access Code: [email or web address] URL: [email or web address] Date: 12/30/2021 Prepared by: Corlis Leak  Patient Education Introduction to Ergonomics Ergonomics for Shoulder Discomfort Office Posture

## 2022-01-06 ENCOUNTER — Ambulatory Visit
Payer: Commercial Managed Care - PPO | Attending: Orthopaedic Surgery | Admitting: Rehabilitative and Restorative Service Providers"

## 2022-01-06 DIAGNOSIS — M6281 Muscle weakness (generalized): Secondary | ICD-10-CM | POA: Insufficient documentation

## 2022-01-06 DIAGNOSIS — R29898 Other symptoms and signs involving the musculoskeletal system: Secondary | ICD-10-CM | POA: Insufficient documentation

## 2022-01-06 DIAGNOSIS — M25512 Pain in left shoulder: Secondary | ICD-10-CM | POA: Insufficient documentation

## 2022-01-06 DIAGNOSIS — R293 Abnormal posture: Secondary | ICD-10-CM | POA: Insufficient documentation

## 2022-01-13 ENCOUNTER — Ambulatory Visit: Payer: Commercial Managed Care - PPO | Admitting: Rehabilitative and Restorative Service Providers"

## 2022-01-20 ENCOUNTER — Other Ambulatory Visit: Payer: Self-pay

## 2022-01-20 ENCOUNTER — Ambulatory Visit: Payer: Commercial Managed Care - PPO | Admitting: Physical Therapy

## 2022-01-20 DIAGNOSIS — M6281 Muscle weakness (generalized): Secondary | ICD-10-CM | POA: Diagnosis present

## 2022-01-20 DIAGNOSIS — M25512 Pain in left shoulder: Secondary | ICD-10-CM

## 2022-01-20 DIAGNOSIS — R29898 Other symptoms and signs involving the musculoskeletal system: Secondary | ICD-10-CM

## 2022-01-20 DIAGNOSIS — R293 Abnormal posture: Secondary | ICD-10-CM | POA: Diagnosis present

## 2022-01-20 NOTE — Therapy (Signed)
[facility] ?[place] ?[place], [place], [address] ?Phone: [phone]   Fax:  [phone] ? ?Physical Therapy Treatment and Discharge ? ?Patient Details  ?Name: Casey Wilson ?MRN: [id number] ?Date of Birth: 14-Jul-1976 ?Referring Provider (PT): Dr Ophelia Charter ? ? ?PHYSICAL THERAPY DISCHARGE SUMMARY ? ?Visits from Start of Care: 22 ? ?Current functional level related to goals / functional outcomes: ?See below ?  ?Remaining deficits: ?Continued rounded shoulder and anterior pec tightness ?  ?Education / Equipment: ?Reinforced scapular squeezes  ? ?Patient agrees to discharge. Patient goals were met. Patient is being discharged due to meeting the stated rehab goals. ? ? ?Encounter Date: 01/20/2022 ? ? PT End of Session - 01/20/22 1353   ? ? Visit Number 22   ? Number of Visits 24   ? Date for PT Re-Evaluation 01/09/22   ? PT Start Time 1400   ? PT Stop Time 1420   ? PT Time Calculation (min) 20 min   ? Activity Tolerance Patient tolerated treatment well   ? Behavior During Therapy [facility] for tasks assessed/performed   ? ?  ?  ? ?  ? ? ?No past medical history on file. ? ?Past Surgical History:  ?Procedure Laterality Date  ? ACHILLES TENDON REPAIR    ? Both feet  ? ORIF WRIST FRACTURE Left 08/11/2021  ? Procedure: OPEN REDUCTION INTERNAL FIXATION (ORIF) DISTAL RADIUS POSSIBLE PINNING DRUJ, POSSIBLE TRIANGULAR FIBER CARTILAGE COMPLEX REPAIR;  Surgeon: Leanora Cover, MD;  Location: [place];  Service: Orthopedics;  Laterality: Left;  ? orthognatic    ? jaw repair for overbite  ? ? ?There were no vitals filed for this visit. ? ? Subjective Assessment - 01/20/22 1404   ? ? Subjective Pt states he thinks he got a pinched nerve from his neck that went to his shoulder ~3 weeks ago. Pt was not able to lift his arm. Saw ortho emergently and was given prednisone shots and oral meds which has improved things. Pt states he is now able to move his arm like before.   ? Pertinent  History jaw surgery moving lower jaw fwd 7 mm; bilat achiles's tendon surgery Lt 2014; Rt 2017   ? Patient Stated Goals get shoulder working again so he can return to normal function   ? Currently in Pain? No/denies   ? Pain Onset More than a month ago   ? ?  ?  ? ?  ? ? ? ? ? [facility] PT Assessment - 01/20/22 0001   ? ?  ? Assessment  ? Medical Diagnosis Lt shoulder AC repair   ? Referring Provider (PT) Dr Ophelia Charter   ? Onset Date/Surgical Date 08/18/21   ? Hand Dominance Right   ? Next MD Visit released from care   ?  ? Observation/Other Assessments  ? Focus on Therapeutic Outcomes (FOTO)  80   ?  ? AROM  ? Right Shoulder Extension 55 Degrees   ? Right Shoulder Flexion 166 Degrees   ? Right Shoulder ABduction 160 Degrees   ? Right Shoulder Internal Rotation --   to ~T8  ? Right Shoulder External Rotation 55 Degrees   ? Left Shoulder Extension 58 Degrees   ? Left Shoulder Flexion 166 Degrees   ? Left Shoulder ABduction 160 Degrees   ? Left Shoulder Internal Rotation --   to T8  ? Left Shoulder External Rotation 55 Degrees   ?  ? Strength  ? Overall Strength Comments resisted  prone "I", "W", "Y" all 5/5   ? Left Shoulder Flexion 5/5   ? Left Shoulder Extension 5/5   ? Left Shoulder ABduction 5/5   ? Left Shoulder Internal Rotation 5/5   ? Left Shoulder External Rotation 5/5   ?  ? Palpation  ? Palpation comment tightness to palpation through the anterior Lt shoulder pecs/clavicular area   ? ?  ?  ? ?  ? ? ? ? ? ? ? ? ? ? ? ? ? ? ? ? ? ? ? ? ? ? ? ? ? PT Education - 01/20/22 1427   ? ? Education Details Reinforced maintaining shoulders back and decreasing his rounded shoulders.   ? Person(s) Educated Patient   ? Methods Explanation   ? Comprehension Verbalized understanding   ? ?  ?  ? ?  ? ? ? PT Short Term Goals - 12/12/21 0849   ? ?  ? PT SHORT TERM GOAL #2  ? Title Instruct patient in initial HEP for Lt shoulder dysfunction following AC dislocation and Lt radius fx   ? ?  ?  ? ?  ? ? ? ? PT Long Term Goals -  01/20/22 1412   ? ?  ? PT LONG TERM GOAL #1  ? Title Improve posture and alignment with patient to demonstrate improved upright posture with posterior shoulder girdle musculature engaged   ? Time 6   ? Period Weeks   ? Status Achieved   ? Target Date 01/09/22   ?  ? PT LONG TERM GOAL #2  ? Title Increase AROM Lt shoulder to WNL's to allow functional use Lt UE   ? Time 6   ? Period Weeks   ? Status Achieved   ? Target Date 01/09/22   ?  ? PT LONG TERM GOAL #3  ? Title Increase strength Lt shoulder to 5/5 to prepare pt to return to work   ? Time 6   ? Period Weeks   ? Status Achieved   ? Target Date 01/09/22   ?  ? PT LONG TERM GOAL #4  ? Title Independent in HEP (including aquatic exercise as indicated)   ? Time 6   ? Period Weeks   ? Status Achieved   ? Target Date 01/09/22   ?  ? PT LONG TERM GOAL #5  ? Title Improve functional limitation score to 64   ? Baseline 80   ? Time 6   ? Period Weeks   ? Status Achieved   ? Target Date 01/09/22   ? ?  ?  ? ?  ? ? ? ? ? ? ? ? Plan - 01/20/22 1422   ? ? Clinical Impression Statement Pt returns after 3 weeks. Pt reports pinched nerve in his neck affecting his left shoulder. Has improved after medical management and he is now back to his prior level. Checked pt's goals and he has met all of his LTGs. Pt's strength has greatly improved and is doing all work tasks without any issues. He feels he has no more PT needs at this time and is ready for d/c.   ? Rehab Potential Good   ? PT Frequency 2x / week   ? PT Duration 6 weeks   ? PT Treatment/Interventions ADLs/Self Care Home Management;Aquatic Therapy;Cryotherapy;Electrical Stimulation;Iontophoresis 73m/ml Dexamethasone;Moist Heat;Ultrasound;Functional mobility training;Therapeutic activities;Therapeutic exercise;Neuromuscular re-education;Patient/family education;Manual techniques;Scar mobilization;Passive range of motion;Dry needling;Vasopneumatic Device   ? PT Next Visit Plan addess flare up of symptoms as  indicted with DN,  manual work, modalities; return to Lt shoulder strength to return to work. Lt shoulder coordination/NMR   ? PT Home Exercise Plan [id number] exercises; [id number] ergonomics   ? Consulted and Agree with Plan of Care Patient   ? ?  ?  ? ?  ? ? ?Patient will benefit from skilled therapeutic intervention in order to improve the following deficits and impairments:  Decreased range of motion, Increased fascial restricitons, Impaired UE functional use, Decreased activity tolerance, Pain, Impaired flexibility, Improper body mechanics, Decreased mobility, Decreased strength, Increased edema, Postural dysfunction ? ?Visit Diagnosis: ?Acute pain of left shoulder ? ?Other symptoms and signs involving the musculoskeletal system ? ?Muscle weakness (generalized) ? ?Abnormal posture ? ? ? ? ?Problem List ?Patient Active Problem List  ? Diagnosis Date Noted  ? Left wrist fracture 08/11/2021  ? ? ?Jeannie Mallinger April Gordy Levan, PT, DPT ?01/20/2022, 2:28 PM ? ?[facility] ?[place] ?[place], [place], [address] ?Phone: [phone]   Fax:  [phone] ? ?Name: Johnluke Haugen ?MRN: [id number] ?Date of Birth: 1976/09/27 ? ? ? ?

## 2022-01-27 ENCOUNTER — Encounter: Payer: Commercial Managed Care - PPO | Admitting: Rehabilitative and Restorative Service Providers"

## 2022-11-17 ENCOUNTER — Encounter: Payer: Self-pay | Admitting: Dietician

## 2022-11-17 ENCOUNTER — Encounter: Payer: No Typology Code available for payment source | Attending: Internal Medicine | Admitting: Dietician

## 2022-11-17 VITALS — Ht 75.0 in | Wt 311.4 lb

## 2022-11-17 DIAGNOSIS — E669 Obesity, unspecified: Secondary | ICD-10-CM | POA: Diagnosis not present

## 2022-11-17 DIAGNOSIS — Z6838 Body mass index (BMI) 38.0-38.9, adult: Secondary | ICD-10-CM | POA: Diagnosis present

## 2022-11-17 NOTE — Progress Notes (Signed)
Medical Nutrition Therapy  Appointment Start time:  [phone]  Appointment End time:  0930  Primary concerns today: Pt states he wants to lose weight.   Referral diagnosis: E66.9 Preferred learning style: no preference indicated Learning readiness: ready   NUTRITION ASSESSMENT   Anthropometrics  Ht: 75in Wt: 311.4lbs  Clinical Medical Hx: HLD Medications: reviewed Labs: reviewed Notable Signs/Symptoms: none Food Allergies: none  Lifestyle & Dietary Hx Pt states he thinks he has sleep apnea but has not gotten a sleep study.   Pt reports he has 2 kids and a newborn on the way.  Pt works for  [employer] from 5:45am-4:30pm. His wife cooks dinner and they eat at [place] reports they cook at home and eat out 50/50.   Pt states he has a gym membership but has not been going but wants to get back into it.   Estimated daily fluid intake: 48-64 oz Supplements: vitamin D Sleep: 5-6 hours Stress / self-care: moderate stress Current average weekly physical activity: ADLs  24-Hr Dietary Recall First Meal: bagel with cream cheese and soda Snack: none Second Meal: leftovers: lasagna with soda  Snack: 2pm: bag of chips and soda Third Meal: home/out: [employer] lasagna OR pizza OR burgers with fries with water or sweet tea or soda Snack: none Beverages: 3-4 soda (coke or mountain dew), sweet tea, 48-64 oz water.   NUTRITION DIAGNOSIS  NB-1.1 Food and nutrition-related knowledge deficit As related to lack of prior nutrition education by a nutrition professional.  As evidenced by pt report and diet history.   NUTRITION INTERVENTION  Nutrition education (E-1) on the following topics:  Building balanced meals and snacks Energy balance MyPlate Benefits of non-starchy vegetables and fruits Impact of reducing sugar-sweetened beverage intake Benefits of physical activity and goals Importance of protein at meals and snacks  Handouts Provided Include  Dish Up a Healthy Meal Snack  Sheet  Learning Style & Readiness for Change Teaching method utilized: Visual & Auditory  Demonstrated degree of understanding via: Teach Back  Barriers to learning/adherence to lifestyle change: none  Goals Established by Pt Aim for 150 minutes of physical activity weekly. Goal: Go to the gym 3 days a week for 60 minutes.   Eat more Non-Starchy Vegetables and Fruits. Goal: Pack fruit and vegetable to go in your lunch box.  Rethink what you drink. Choose beverages without added sugar. Look for 0 carbs on the label. Goal: Reduce soda to 1x/day.    MONITORING & EVALUATION Dietary intake, weekly physical activity, and follow up in 3 months.  Next Steps  Patient is to call for questions.

## 2022-11-17 NOTE — Patient Instructions (Addendum)
Aim for 150 minutes of physical activity weekly. Goal: Go to the gym 3 days a week for 60 minutes.   Eat more Non-Starchy Vegetables and Fruits. Goal: Pack fruit and vegetable to go in your lunch box.  Rethink what you drink. Choose beverages without added sugar. Look for 0 carbs on the label. Goal: Reduce soda to 1x/day.

## 2023-02-09 ENCOUNTER — Ambulatory Visit: Payer: No Typology Code available for payment source | Admitting: Dietician

## 2024-01-04 ENCOUNTER — Encounter: Payer: Self-pay | Admitting: Plastic Surgery

## 2024-01-04 ENCOUNTER — Ambulatory Visit: Payer: Commercial Managed Care - PPO | Admitting: Plastic Surgery

## 2024-01-04 VITALS — BP 131/76 | HR 67 | Ht 75.0 in | Wt 308.0 lb

## 2024-01-04 DIAGNOSIS — H0261 Xanthelasma of right upper eyelid: Secondary | ICD-10-CM | POA: Diagnosis not present

## 2024-01-04 DIAGNOSIS — H0260 Xanthelasma of unspecified eye, unspecified eyelid: Secondary | ICD-10-CM | POA: Insufficient documentation

## 2024-01-04 NOTE — Progress Notes (Signed)
 Patient ID: Casey Wilson, male    DOB: Dec 10, 1975, 48 y.o.   MRN: [id number]   Chief Complaint  Patient presents with   Advice Only    The patient is a 48 year old male here for evaluation of his right upper eyelid.  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  He had his left 1 excised from [facility].  The physician has since left so he came to see [place] for the right side.  He is otherwise in good health.  He does have high cholesterol and he is working with his PCP to get that lowered.  The xanthelasma is about a centimeter in size on the medial aspect of the right upper lid.  No other concerning skin lesions are noted.     Review of Systems  Constitutional: Negative.   HENT: Negative.    Eyes: Negative.   Respiratory: Negative.    Cardiovascular: Negative.   Gastrointestinal: Negative.   Endocrine: Negative.   Genitourinary: Negative.     No past medical history on file.  Past Surgical History:  Procedure Laterality Date   ACHILLES TENDON REPAIR     Both feet   ORIF WRIST FRACTURE Left 08/11/2021   Procedure: OPEN REDUCTION INTERNAL FIXATION (ORIF) DISTAL RADIUS POSSIBLE PINNING DRUJ, POSSIBLE TRIANGULAR FIBER CARTILAGE COMPLEX REPAIR;  Surgeon: Betha Loa, MD;  Location[facility];  Service: Orthopedics;  Laterality: Left;   orthognatic     jaw repair for overbite      Current Outpatient Medications:    ascorbic acid (VITAMIN C) 1000 MG tablet, Take 1 tablet (1,000 mg total) by mouth daily., Disp: , Rfl:    atorvastatin (LIPITOR) 20 MG tablet, Take 20 mg by mouth daily., Disp: , Rfl:    atorvastatin (LIPITOR) 80 MG tablet, Take 80 mg by mouth daily., Disp: , Rfl:    docusate sodium (COLACE) 100 MG capsule, Take 1 capsule (100 mg total) by mouth 2 (two) times daily., Disp: 10 capsule, Rfl: 0   ibuprofen (ADVIL) 200 MG tablet, Take 2-3 tablets (400-600 mg total) by mouth every 8 (eight) hours as needed for mild pain or moderate pain., Disp: ,  Rfl:    loratadine (CLARITIN) 10 MG tablet, Take 1 tablet (10 mg total) by mouth daily., Disp: 30 tablet, Rfl: 0   loratadine (CLARITIN) 10 MG tablet, Take 10 mg by mouth daily as needed for allergies., Disp: , Rfl:    methocarbamol (ROBAXIN) 750 MG tablet, Take 1 tablet (750 mg total) by mouth every 6 (six) hours as needed for muscle spasms., Disp: 30 tablet, Rfl: 1   polyethylene glycol (MIRALAX / GLYCOLAX) 17 g packet, Take 17 g by mouth 2 (two) times daily., Disp: 14 each, Rfl: 0   ciprofloxacin (CIPRO) 500 MG tablet, Take 1 tablet (500 mg total) by mouth every 12 (twelve) hours., Disp: 10 tablet, Rfl: 0   Objective:   Vitals:   01/04/24 0923  BP: 131/76  Pulse: 67  SpO2: 95%    Physical Exam Eyes:   Cardiovascular:     Rate and Rhythm: Normal rate.     Pulses: Normal pulses.  Skin:    General: Skin is warm.     Capillary Refill: Capillary refill takes less than 2 seconds.     Coloration: Skin is not jaundiced.     Findings: Lesion present. No bruising.  Neurological:     Mental Status: He is oriented to person, place, and time.  Psychiatric:        Mood and Affect: Mood normal.        Behavior: Behavior normal.        Thought Content: Thought content normal.        Judgment: Judgment normal.     Assessment & Plan:  Xanthelasma  Plan for excision of right upper lid xanthelasma.  We can do this here in the office.  The patient is aware of swelling and bruising after the surgery.    ICD-10-CM   1. Xanthelasma  H02.60         Alena Bills Julee Stoll, DO

## 2024-01-15 ENCOUNTER — Telehealth: Payer: Self-pay | Admitting: Plastic Surgery

## 2024-01-15 NOTE — Telephone Encounter (Signed)
 Called pt and he agreed to move procedure from May 2025 to 01-2024

## 2024-01-24 ENCOUNTER — Ambulatory Visit (INDEPENDENT_AMBULATORY_CARE_PROVIDER_SITE_OTHER): Admitting: Plastic Surgery

## 2024-01-24 ENCOUNTER — Encounter: Payer: Self-pay | Admitting: Plastic Surgery

## 2024-01-24 ENCOUNTER — Other Ambulatory Visit (HOSPITAL_COMMUNITY)
Admission: RE | Admit: 2024-01-24 | Discharge: 2024-01-24 | Disposition: A | Discharge status: Home / Self Care | Source: Ambulatory Visit | Attending: Plastic Surgery | Admitting: Plastic Surgery

## 2024-01-24 VITALS — BP 127/80 | HR 68

## 2024-01-24 DIAGNOSIS — H0260 Xanthelasma of unspecified eye, unspecified eyelid: Secondary | ICD-10-CM

## 2024-01-24 DIAGNOSIS — H0261 Xanthelasma of right upper eyelid: Secondary | ICD-10-CM

## 2024-01-24 NOTE — Progress Notes (Signed)
 Procedure Note  Preoperative Dx: right upper eyelid xanthelasma  Postoperative Dx: Same  Procedure: excision of right upper eyelid xanthelasma 1 cm  Anesthesia: Lidocaine 1% with 1:100,000 epinephrine  Indication for Procedure: skin lesion  Description of Procedure: Risks and complications were explained to the patient.  Consent was confirmed and the patient understands the risks and benefits.  The potential complications and alternatives were explained and the patient consents.  The patient expressed understanding the option of not having the procedure and the risks of a scar.  Time out was called and all information was confirmed to be correct.    The area was prepped and drapped.  Lidocaine 1% with epinephrine was injected in the subcutaneous area.  After waiting several minutes for the local to take affect a #15 blade was used to excise the area in an eliptical pattern.  The skin edges were reapproximated with 5-0 Monocryl.  The patient was given instructions on how to care for the area and a follow up appointment.  [place] tolerated the procedure well and there were no complications. The specimen was sent to pathology.

## 2024-01-28 LAB — SURGICAL PATHOLOGY

## 2024-02-01 ENCOUNTER — Ambulatory Visit (INDEPENDENT_AMBULATORY_CARE_PROVIDER_SITE_OTHER): Admitting: Physician Assistant

## 2024-02-01 VITALS — BP 120/77 | HR 82

## 2024-02-01 DIAGNOSIS — H0260 Xanthelasma of unspecified eye, unspecified eyelid: Secondary | ICD-10-CM

## 2024-02-01 NOTE — Progress Notes (Signed)
 Patient is a pleasant 48 year old male s/p excision of right upper eyelid xanthelasma performed in office 01/24/2024 by Dr. Ulice Bold who presents to clinic for postprocedural follow-up.  Reviewed the procedure note and the excision site was closed with 5-0 Monocryl.  The specimen was sent to pathology and consistent with benign xanthelasma.  Patient reports that he has had mild yellowish drainage, but denies any pain or other concerns.  On exam, there does appear to be slight dehiscence of the excision site.  However, largely superficial.  Mild incisional scabbing/slough.  No appreciable drainage on exam.  Mildly irritated, but no surrounding induration or cellulitic changes.  The excision site does appear to be a bit dry.  The suture was removed as it did not appear to be approximating the edges.  Did not feel as though it would cause any worsening.    Recommending Vaseline regularly to help with moisturization.  It should heal without complication or difficulty.  Vascular area, so it is relatively low risk for infection.  However, advised him to call the office should he have any questions or concerns and would be happy to see him again.  Also discussed that recurrence of xanthelasma is common after excision if the underlying lipid issue is not controlled.  However, he has been seeing his PCP regularly and taking statins as directed.  Picture(s) obtained of the patient and placed in the chart were with the patient's or guardian's permission.

## 2024-03-28 ENCOUNTER — Ambulatory Visit: Payer: No Typology Code available for payment source | Admitting: Plastic Surgery

## 2024-04-04 ENCOUNTER — Ambulatory Visit: Payer: No Typology Code available for payment source | Admitting: Surgical
